# Patient Record
Sex: Female | Born: 1980 | Hispanic: Yes | Marital: Single | Smoking: Never smoker
Health system: Southern US, Community
[De-identification: ages and names within clinical notes are randomized; demographics above are authoritative.]

## PROBLEM LIST (undated history)

## (undated) ENCOUNTER — Inpatient Hospital Stay (HOSPITAL_COMMUNITY): Payer: Self-pay

## (undated) DIAGNOSIS — O09299 Supervision of pregnancy with other poor reproductive or obstetric history, unspecified trimester: Secondary | ICD-10-CM

## (undated) DIAGNOSIS — O4590 Premature separation of placenta, unspecified, unspecified trimester: Secondary | ICD-10-CM

## (undated) DIAGNOSIS — R110 Nausea: Secondary | ICD-10-CM

## (undated) DIAGNOSIS — I10 Essential (primary) hypertension: Secondary | ICD-10-CM

## (undated) DIAGNOSIS — N926 Irregular menstruation, unspecified: Secondary | ICD-10-CM

## (undated) DIAGNOSIS — Z8759 Personal history of other complications of pregnancy, childbirth and the puerperium: Secondary | ICD-10-CM

## (undated) DIAGNOSIS — R109 Unspecified abdominal pain: Secondary | ICD-10-CM

## (undated) DIAGNOSIS — D65 Disseminated intravascular coagulation [defibrination syndrome]: Secondary | ICD-10-CM

## (undated) HISTORY — DX: Unspecified abdominal pain: R10.9

## (undated) HISTORY — DX: Nausea: R11.0

## (undated) HISTORY — DX: Irregular menstruation, unspecified: N92.6

---

## 2007-05-02 ENCOUNTER — Ambulatory Visit (HOSPITAL_COMMUNITY): Admission: RE | Admit: 2007-05-02 | Discharge: 2007-05-02 | Payer: Self-pay | Admitting: Obstetrics and Gynecology

## 2008-12-23 ENCOUNTER — Emergency Department (HOSPITAL_COMMUNITY): Admission: EM | Admit: 2008-12-23 | Discharge: 2008-12-23 | Payer: Self-pay | Admitting: Emergency Medicine

## 2010-07-12 ENCOUNTER — Other Ambulatory Visit (HOSPITAL_COMMUNITY)
Admission: RE | Admit: 2010-07-12 | Discharge: 2010-07-12 | Disposition: A | Discharge status: Home / Self Care | Payer: Self-pay | Source: Ambulatory Visit | Attending: Obstetrics and Gynecology | Admitting: Obstetrics and Gynecology

## 2010-07-12 DIAGNOSIS — Z01419 Encounter for gynecological examination (general) (routine) without abnormal findings: Secondary | ICD-10-CM | POA: Insufficient documentation

## 2010-07-12 DIAGNOSIS — Z113 Encounter for screening for infections with a predominantly sexual mode of transmission: Secondary | ICD-10-CM | POA: Insufficient documentation

## 2010-09-18 LAB — URINE MICROSCOPIC-ADD ON

## 2010-09-18 LAB — URINALYSIS, ROUTINE W REFLEX MICROSCOPIC
Nitrite: NEGATIVE
Specific Gravity, Urine: 1.025 (ref 1.005–1.030)
pH: 5.5 (ref 5.0–8.0)

## 2010-09-18 LAB — PREGNANCY, URINE: Preg Test, Ur: NEGATIVE

## 2010-11-04 DIAGNOSIS — O1420 HELLP syndrome (HELLP), unspecified trimester: Secondary | ICD-10-CM

## 2010-11-05 ENCOUNTER — Encounter (HOSPITAL_COMMUNITY): Payer: Self-pay | Admitting: Radiology

## 2010-11-05 ENCOUNTER — Inpatient Hospital Stay (HOSPITAL_COMMUNITY): Payer: Medicaid Other

## 2010-11-05 ENCOUNTER — Inpatient Hospital Stay (HOSPITAL_COMMUNITY)
Admission: AD | Admit: 2010-11-05 | Discharge: 2010-11-09 | DRG: 774 | Disposition: A | Discharge status: Home / Self Care | Payer: Medicaid Other | Source: Ambulatory Visit | Attending: Obstetrics & Gynecology | Admitting: Obstetrics & Gynecology

## 2010-11-05 DIAGNOSIS — O1414 Severe pre-eclampsia complicating childbirth: Secondary | ICD-10-CM | POA: Diagnosis present

## 2010-11-05 DIAGNOSIS — D689 Coagulation defect, unspecified: Secondary | ICD-10-CM | POA: Diagnosis present

## 2010-11-05 DIAGNOSIS — R109 Unspecified abdominal pain: Secondary | ICD-10-CM

## 2010-11-05 DIAGNOSIS — D65 Disseminated intravascular coagulation [defibrination syndrome]: Secondary | ICD-10-CM

## 2010-11-05 DIAGNOSIS — O34219 Maternal care for unspecified type scar from previous cesarean delivery: Secondary | ICD-10-CM | POA: Diagnosis present

## 2010-11-05 DIAGNOSIS — O4590 Premature separation of placenta, unspecified, unspecified trimester: Principal | ICD-10-CM | POA: Diagnosis present

## 2010-11-05 DIAGNOSIS — O364XX0 Maternal care for intrauterine death, not applicable or unspecified: Secondary | ICD-10-CM | POA: Diagnosis not present

## 2010-11-05 DIAGNOSIS — IMO0002 Reserved for concepts with insufficient information to code with codable children: Secondary | ICD-10-CM

## 2010-11-05 LAB — COMPREHENSIVE METABOLIC PANEL
AST: 37 U/L (ref 0–37)
Alkaline Phosphatase: 274 U/L — ABNORMAL HIGH (ref 39–117)
BUN: 19 mg/dL (ref 6–23)
CO2: 15 mEq/L — ABNORMAL LOW (ref 19–32)
Calcium: 8.9 mg/dL (ref 8.4–10.5)
GFR calc Af Amer: >60 mL/min (ref >60)
GFR calc non Af Amer: >60 mL/min (ref >60)
Glucose, Bld: 94 mg/dL (ref 70–99)
Potassium: 4.3 mEq/L (ref 3.5–5.1)
Sodium: 133 mEq/L — ABNORMAL LOW (ref 135–145)
Total Bilirubin: 1 mg/dL (ref 0.3–1.2)
Total Protein: 6.5 g/dL (ref 6.0–8.3)

## 2010-11-05 LAB — CBC
HCT: 35 % — ABNORMAL LOW (ref 36.0–46.0)
Hemoglobin: 12 g/dL (ref 12.0–15.0)
MCH: 29 pg (ref 26.0–34.0)
Platelets: 108 10*3/uL — ABNORMAL LOW (ref 150–400)
RBC: 4.14 MIL/uL (ref 3.87–5.11)
RDW: 13.2 % (ref 11.5–15.5)
WBC: 18 10*3/uL — ABNORMAL HIGH (ref 4.0–10.5)

## 2010-11-06 ENCOUNTER — Other Ambulatory Visit: Payer: Self-pay | Admitting: Obstetrics and Gynecology

## 2010-11-06 ENCOUNTER — Inpatient Hospital Stay (HOSPITAL_COMMUNITY): Payer: Self-pay

## 2010-11-06 ENCOUNTER — Inpatient Hospital Stay (HOSPITAL_COMMUNITY): Payer: Medicaid Other

## 2010-11-06 DIAGNOSIS — O9912 Other diseases of the blood and blood-forming organs and certain disorders involving the immune mechanism complicating childbirth: Secondary | ICD-10-CM

## 2010-11-06 DIAGNOSIS — D65 Disseminated intravascular coagulation [defibrination syndrome]: Secondary | ICD-10-CM

## 2010-11-06 DIAGNOSIS — O4590 Premature separation of placenta, unspecified, unspecified trimester: Secondary | ICD-10-CM

## 2010-11-06 DIAGNOSIS — O364XX0 Maternal care for intrauterine death, not applicable or unspecified: Secondary | ICD-10-CM

## 2010-11-06 DIAGNOSIS — D684 Acquired coagulation factor deficiency: Secondary | ICD-10-CM

## 2010-11-06 DIAGNOSIS — D689 Coagulation defect, unspecified: Secondary | ICD-10-CM

## 2010-11-06 DIAGNOSIS — O1410 Severe pre-eclampsia, unspecified trimester: Secondary | ICD-10-CM

## 2010-11-06 LAB — URINALYSIS, DIPSTICK ONLY
Ketones, ur: 15 mg/dL — AB
Leukocytes, UA: NEGATIVE
Specific Gravity, Urine: >1.03 — ABNORMAL HIGH (ref 1.005–1.030)
pH: 5.5 (ref 5.0–8.0)

## 2010-11-06 LAB — CBC
HCT: 26.7 % — ABNORMAL LOW (ref 36.0–46.0)
HCT: 25.8 % — ABNORMAL LOW (ref 36.0–46.0)
Hemoglobin: 9 g/dL — ABNORMAL LOW (ref 12.0–15.0)
Hemoglobin: 8.9 g/dL — ABNORMAL LOW (ref 12.0–15.0)
MCH: 29.5 pg (ref 26.0–34.0)
MCH: 29.4 pg (ref 26.0–34.0)
MCHC: 33.7 g/dL (ref 30.0–36.0)
MCHC: 35.1 g/dL (ref 30.0–36.0)
MCV: 85.3 fL (ref 78.0–100.0)
MCV: 84.9 fL (ref 78.0–100.0)
MCV: 84.8 fL (ref 78.0–100.0)
MCV: 83.6 fL (ref 78.0–100.0)
Platelets: 110 10*3/uL — ABNORMAL LOW (ref 150–400)
Platelets: 80 10*3/uL — ABNORMAL LOW (ref 150–400)
Platelets: 97 10*3/uL — ABNORMAL LOW (ref 150–400)
RBC: 3.13 MIL/uL — ABNORMAL LOW (ref 3.87–5.11)
RDW: 13.4 % (ref 11.5–15.5)
RDW: 13.6 % (ref 11.5–15.5)
RDW: 13.8 % (ref 11.5–15.5)
WBC: 14.8 10*3/uL — ABNORMAL HIGH (ref 4.0–10.5)

## 2010-11-06 LAB — DIC (DISSEMINATED INTRAVASCULAR COAGULATION)PANEL
D-Dimer, Quant: >20 ug/mL-FEU — ABNORMAL HIGH (ref 0.00–0.48)
D-Dimer, Quant: >20 ug/mL-FEU — ABNORMAL HIGH (ref 0.00–0.48)
INR: 1.18 (ref 0.00–1.49)
INR: 1.19 (ref 0.00–1.49)
Platelets: 112 10*3/uL — ABNORMAL LOW (ref 150–400)
Prothrombin Time: 15.2 seconds (ref 11.6–15.2)
Smear Review: NONE SEEN
Smear Review: NONE SEEN

## 2010-11-06 LAB — COMPREHENSIVE METABOLIC PANEL
ALT: 12 U/L (ref 0–35)
AST: 35 U/L (ref 0–37)
Albumin: 2.1 g/dL — ABNORMAL LOW (ref 3.5–5.2)
Alkaline Phosphatase: 318 U/L — ABNORMAL HIGH (ref 39–117)
Chloride: 102 mEq/L (ref 96–112)
Chloride: 102 mEq/L (ref 96–112)
GFR calc Af Amer: >60 mL/min (ref >60)
GFR calc Af Amer: >60 mL/min (ref >60)
Glucose, Bld: 148 mg/dL — ABNORMAL HIGH (ref 70–99)
Potassium: 4.3 mEq/L (ref 3.5–5.1)
Potassium: 4.8 mEq/L (ref 3.5–5.1)
Sodium: 136 mEq/L (ref 135–145)
Total Bilirubin: 1.3 mg/dL — ABNORMAL HIGH (ref 0.3–1.2)
Total Protein: 5.9 g/dL — ABNORMAL LOW (ref 6.0–8.3)
Total Protein: 4.9 g/dL — ABNORMAL LOW (ref 6.0–8.3)

## 2010-11-06 LAB — URINALYSIS, MICROSCOPIC ONLY
Leukocytes, UA: NEGATIVE
Specific Gravity, Urine: >1.03 — ABNORMAL HIGH (ref 1.005–1.030)
Urobilinogen, UA: 0.2 mg/dL (ref 0.0–1.0)

## 2010-11-06 LAB — MRSA PCR SCREENING: MRSA by PCR: NEGATIVE

## 2010-11-06 LAB — BASIC METABOLIC PANEL
BUN: 21 mg/dL (ref 6–23)
Creatinine, Ser: 0.85 mg/dL (ref 0.4–1.2)
GFR calc non Af Amer: >60 mL/min (ref >60)

## 2010-11-06 LAB — MAGNESIUM: Magnesium: 7.4 mg/dL (ref 1.5–2.5)

## 2010-11-06 LAB — RPR: RPR Ser Ql: NONREACTIVE

## 2010-11-07 ENCOUNTER — Inpatient Hospital Stay (HOSPITAL_COMMUNITY): Payer: Medicaid Other

## 2010-11-07 ENCOUNTER — Encounter (HOSPITAL_COMMUNITY): Payer: Self-pay | Admitting: Radiology

## 2010-11-07 LAB — CBC
HCT: 26.6 % — ABNORMAL LOW (ref 36.0–46.0)
HCT: 26.7 % — ABNORMAL LOW (ref 36.0–46.0)
MCH: 28.9 pg (ref 26.0–34.0)
MCH: 28.7 pg (ref 26.0–34.0)
MCV: 80.9 fL (ref 78.0–100.0)
MCV: 81.4 fL (ref 78.0–100.0)
Platelets: 76 10*3/uL — ABNORMAL LOW (ref 150–400)
Platelets: 83 10*3/uL — ABNORMAL LOW (ref 150–400)
RBC: 3.28 MIL/uL — ABNORMAL LOW (ref 3.87–5.11)
RDW: 13.9 % (ref 11.5–15.5)
WBC: 12.9 10*3/uL — ABNORMAL HIGH (ref 4.0–10.5)

## 2010-11-07 LAB — PREPARE CRYOPRECIPITATE
Unit division: 0
Unit division: 0
Unit division: 0
Unit division: 0
Unit division: 0
Unit division: 0
Unit division: 0
Unit division: 0

## 2010-11-07 LAB — PREPARE FRESH FROZEN PLASMA: Unit division: 0

## 2010-11-07 LAB — PREPARE PLATELET PHERESIS: Unit division: 0

## 2010-11-07 LAB — DIC (DISSEMINATED INTRAVASCULAR COAGULATION)PANEL
D-Dimer, Quant: 3.87 ug/mL-FEU — ABNORMAL HIGH (ref 0.00–0.48)
Fibrinogen: 260 mg/dL (ref 204–475)
Prothrombin Time: 13.8 seconds (ref 11.6–15.2)

## 2010-11-07 LAB — COMPREHENSIVE METABOLIC PANEL
Albumin: 1.8 g/dL — ABNORMAL LOW (ref 3.5–5.2)
Alkaline Phosphatase: 232 U/L — ABNORMAL HIGH (ref 39–117)
BUN: 16 mg/dL (ref 6–23)
Creatinine, Ser: 0.7 mg/dL (ref 0.4–1.2)
Glucose, Bld: 85 mg/dL (ref 70–99)
Total Bilirubin: 0.6 mg/dL (ref 0.3–1.2)
Total Protein: 4.5 g/dL — ABNORMAL LOW (ref 6.0–8.3)

## 2010-11-07 LAB — MAGNESIUM: Magnesium: 5 mg/dL — ABNORMAL HIGH (ref 1.5–2.5)

## 2010-11-08 LAB — CROSSMATCH
ABO/RH(D): O POS
Antibody Screen: NEGATIVE
Unit division: 0
Unit division: 0
Unit division: 0
Unit division: 0

## 2010-11-08 LAB — CBC
HCT: 26 % — ABNORMAL LOW (ref 36.0–46.0)
Hemoglobin: 9.1 g/dL — ABNORMAL LOW (ref 12.0–15.0)
MCH: 28.9 pg (ref 26.0–34.0)
MCHC: 35 g/dL (ref 30.0–36.0)
MCV: 82.5 fL (ref 78.0–100.0)
RBC: 3.15 MIL/uL — ABNORMAL LOW (ref 3.87–5.11)

## 2010-11-08 LAB — PREPARE PLATELET PHERESIS

## 2010-11-08 LAB — COMPREHENSIVE METABOLIC PANEL
AST: 21 U/L (ref 0–37)
BUN: 9 mg/dL (ref 6–23)
CO2: 28 mEq/L (ref 19–32)
Calcium: 6.8 mg/dL — ABNORMAL LOW (ref 8.4–10.5)
Chloride: 96 mEq/L (ref 96–112)
Creatinine, Ser: 0.52 mg/dL (ref 0.4–1.2)
GFR calc non Af Amer: >60 mL/min (ref >60)
Glucose, Bld: 80 mg/dL (ref 70–99)
Total Bilirubin: 0.4 mg/dL (ref 0.3–1.2)

## 2010-11-10 NOTE — Discharge Summary (Signed)
NAME:  Sheena Wagner, Sheena Wagner        ACCOUNT NO.:  [id number]  MEDICAL RECORD NO.:  [id number]           PATIENT TYPE:  I  LOCATION:  9318                          FACILITY:  [facility]  PHYSICIAN:  Lucina Mellow, DO   DATE OF BIRTH:  11/02/1980  DATE OF ADMISSION:  11/05/2010 DATE OF DISCHARGE:  11/09/2010                              DISCHARGE SUMMARY   HISTORY OF PRESENT ILLNESS:  The patient is a 30 year old Hispanic female, gravida 3, now para 3-0-0-3 who presented at 31 weeks estimated gestational age with constant abdominal pain to the MAU.  She is a [facility] patient who began her prenatal care at approximately 17 weeks with the previous obstetrical history of a c-section in 2002 and a vaginal birth after cesarean section in 2004.  The patient had no significant obstetrical complications during this current pregnancy by records.  When she presented to the MAU, she was noted to be hypertensive at 160/110.  Fetal heart tones were unable to be obtained, then ultrasound verified that there was an IUD.  There was also ultrasound evidence of a placental abruption.  The patient was also diagnosed with preeclampsia.  The patient was admitted for induction of labor with Pitocin and magnesium sulfate was given.  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  The patient received 8 units of packed red blood cells, 4 units of fresh frozen plasma, and 3 packs of platelets during her induction and delivery.  A Bakri balloon was placed after 3500 mL of clot were measured and weighed after the delivery of the placenta.  The Bakri balloon was discontinued completely on Nov 07, 2010.  The patient received 24 hours of magnesium sulfate after the delivery of her infant.  The patient tolerated the postpartum period well and developed some face and neck pain which was relieved with Flexeril.  The patient's bleeding slowed and her platelets came  up to 100 on the day of discharge as well as a hemoglobin of 9.1.  The patient feels well and is ready to go home.  Plans for the infant remains are that the family is going to have the baby cremated and they will fly the remains back to [place].  The patient continued to have elevated blood pressures during her postpartum period and was placed on hydrochlorothiazide 25 once a day and labetalol 400 twice a day.  The patient's blood pressures were controlled at the time of discharge at 136/81.  The patient is having very light lochia and her pain is controlled.  Upon discharge, the patient is given instructions that she will follow up in [facility] on Friday, November 17, 2010 at 9:30.  She is recommended to abstain from intercourse and provide pelvic rest for at least 4-6 weeks.  The patient is instructed that she should consult with her OB/GYN provider or primary care provider prior to conceiving again to ensure that she is no longer having issues with anemia, bleeding, or blood pressure and these are treated adequately.  She is also instructed that subsequent pregnancies have a high risk of having the same complications with blood  pressure, i.e. preeclampsia, HELLP syndrome, and DIC.  The patient and her partner voiced understanding.  On discharge, the patient is instructed that she should return to the MAU promptly if she develops headache, vision changes, shortness of breath or chest pain or her bleeding increases.  DISCHARGE MEDICATIONS: 1. Hydrochlorothiazide 25 mg each day. 2. Labetalol 400 mg twice a day. 3. Percocet 5/325 one tablet every 4 hours as needed for pain and she     is recommended to purchase them each day. 4. Over-the-counter prenatal vitamins. 5. Colace 100 mg. 6. Iron 325 mg.  All the patient's questions were answered and she is discharged home in stable condition.          ______________________________ Lucina Mellow, DO     SH/MEDQ  D:  11/09/2010   T:  Dec 09, 2010  Job:  [id number]  Electronically Signed by Lucina Mellow MD on 12-09-10 02:17:08 PM

## 2010-12-10 DEATH — deceased

## 2011-04-09 ENCOUNTER — Other Ambulatory Visit: Payer: Self-pay | Admitting: Obstetrics and Gynecology

## 2011-04-12 ENCOUNTER — Ambulatory Visit (HOSPITAL_COMMUNITY)
Admission: RE | Admit: 2011-04-12 | Discharge: 2011-04-12 | Disposition: A | Discharge status: Home / Self Care | Payer: Medicaid Other | Source: Ambulatory Visit | Attending: Obstetrics and Gynecology | Admitting: Obstetrics and Gynecology

## 2011-04-12 ENCOUNTER — Other Ambulatory Visit (HOSPITAL_COMMUNITY): Payer: Self-pay | Admitting: Obstetrics and Gynecology

## 2011-04-12 ENCOUNTER — Other Ambulatory Visit (HOSPITAL_COMMUNITY): Payer: Medicaid Other

## 2011-04-12 DIAGNOSIS — N854 Malposition of uterus: Secondary | ICD-10-CM | POA: Insufficient documentation

## 2011-04-12 DIAGNOSIS — N949 Unspecified condition associated with female genital organs and menstrual cycle: Secondary | ICD-10-CM | POA: Insufficient documentation

## 2011-08-09 ENCOUNTER — Emergency Department (HOSPITAL_COMMUNITY): Payer: Self-pay

## 2011-08-09 ENCOUNTER — Encounter (HOSPITAL_COMMUNITY): Payer: Self-pay

## 2011-08-09 ENCOUNTER — Emergency Department (HOSPITAL_COMMUNITY)
Admission: EM | Admit: 2011-08-09 | Discharge: 2011-08-09 | Disposition: A | Discharge status: Home / Self Care | Payer: Self-pay | Attending: Emergency Medicine | Admitting: Emergency Medicine

## 2011-08-09 DIAGNOSIS — M25561 Pain in right knee: Secondary | ICD-10-CM

## 2011-08-09 DIAGNOSIS — M25569 Pain in unspecified knee: Secondary | ICD-10-CM | POA: Insufficient documentation

## 2011-08-09 NOTE — ED Provider Notes (Addendum)
History     CSN: [id number]  Arrival date & time 08/09/11  1105   First MD Initiated Contact with Patient 08/09/11 1132      Chief Complaint  Patient presents with  . Abscess    (Consider location/radiation/quality/duration/timing/severity/associated sxs/prior treatment) HPI Comments: C/o R knee pain ~ 6 months.  No known injury.  Worse in past few days.  The history is provided by the patient. A language interpreter was used (pt's english speaking friend is with her.).    History reviewed. No pertinent past medical history.  History reviewed. No pertinent past surgical history.  No family history on file.  History  Substance Use Topics  . Smoking status: Not on file  . Smokeless tobacco: Not on file  . Alcohol Use: No    OB History    Grav Para Term Preterm Abortions TAB SAB Ect Mult Living   1               Review of Systems  Musculoskeletal:       Knee pain  All other systems reviewed and are negative.    Allergies  Review of patient's allergies indicates no known allergies.  Home Medications  No current outpatient prescriptions on file.  BP 124/80  Pulse 90  Temp(Src) 97.9 F (36.6 C) (Oral)  Resp 18  Wt 135 lb (61.236 kg)  SpO2 100%  LMP 07/19/2011  Physical Exam  Nursing note and vitals reviewed. Constitutional: She is oriented to person, place, and time. She appears well-developed and well-nourished. No distress.  HENT:  Head: Normocephalic and atraumatic.  Eyes: EOM are normal.  Neck: Normal range of motion.  Cardiovascular: Normal rate, regular rhythm and normal heart sounds.   Pulmonary/Chest: Effort normal and breath sounds normal.  Abdominal: Soft. She exhibits no distension. There is no tenderness.  Musculoskeletal: She exhibits tenderness.       Legs: Neurological: She is alert and oriented to person, place, and time.  Skin: Skin is warm and dry.  Psychiatric: She has a normal mood and affect. Judgment normal.    ED Course    Procedures (including critical care time)  Labs Reviewed - No data to display Dg Knee Complete 4 Views Right  08/09/2011  *RADIOLOGY REPORT*  Clinical Data: Right knee pain.  RIGHT KNEE - COMPLETE 4+ VIEW  Comparison: None.  Findings: There is no fracture, dislocation, joint effusion, or bone destruction.  IMPRESSION: Normal right knee.  Original Report Authenticated By: Gwynn Burly, M.D.     1. Right knee pain       MDM  Normal x-rays of knee.  Suspect baker's cyst.  Have ordered u/s of R leg to  Further evaluate.  Ice, ibuprofen and ortho f/u        Worthy Rancher, [place] 08/09/11 1605  Worthy Rancher, [place] 08/09/11 [phone]

## 2011-08-09 NOTE — ED Provider Notes (Signed)
Medical screening examination/treatment/procedure(s) were performed by non-physician practitioner and as supervising physician I was immediately available for consultation/collaboration.   Benny Lennert, MD 08/09/11 [phone]

## 2011-08-09 NOTE — ED Notes (Signed)
R Miller in with pt

## 2011-08-09 NOTE — Discharge Instructions (Signed)
Dolor en la rodilla (Knee Pain) La rodilla es la articulacin compleja entre el muslo y la parte inferior de la pierna. En esta articulacin hay huesos, tendones, ligamentos y TEFL teacher. Los huesos que forman la rodilla son:  El fmur en el muslo.   La tibia y el peron en la pierna.   La rtula montada en la ranura de la parte inferior del muslo.  CAUSAS El dolor de rodilla es una causa frecuente de [place] y puede tener varias causas. Algunas son:  Lesiones como:   Ruptura de ligamento o lesin en el tendn.   Esguince del Armed forces operational officer como:   Gota   Artritis   Infecciones   Uso excesivo, demasiado entrenamiento o mucha actividad fsica.  El dolor de rodilla puede ser leve o intenso. Puede acompaar una lesin debilitante. Los problemas leves con frecuencia responden bien a tratamientos caseros o se mejoran por s mismas. Las lesiones ms graves pueden requerir la intervencin del mdico y [place]. SNTOMAS La rodilla es una articulacin compleja. Los sntomas pueden variar ampliamente Algunos son:  Dolor con el movimiento o al soportar peso.   Hinchazn y Engineer, mining.   Torsin de la rodilla.   Imposibilidad para estirar la rodilla.   La rodilla se traba y no puede enderezarla.   Siente calor y se observa enrojecimiento con dolor y [place].   Deformidad o dislocacin de la rtula.  DIAGNSTICO Determinar cual es el problema puede ser bastante simple, como cuando hay una lesin. Tambin puede ser [employer] debido a la complejidad de la rodilla. Las pruebas para Education officer, environmental un diagnstico son:  Marily Memos y examen fsico por parte del mdico.   Radiografas para descartar otros problemas. Las radiografas no mostrarn la ruptura del TEFL teacher. Algunas lesiones en la rodilla pueden diagnosticarse del siguiente modo:   La artroscopia es una tcnica quirrgica por la que una pequea cmara de vdeo se inserta en pequeas incisiones que se hacen a los  lados de la rodilla. Este procedimiento se [place] para examinar y Therapist, sports los problemas de la articulacin interna de la rodilla. Se utilizan pequeos instrumentos para reparar el cartlago roto (meniscos).   La artrografa es una tcnica radiolgica. Se inyecta un lquido de contraste en la articulacin de la rodilla. Las estructuras internas de la articulacin de la rodilla se hacen visibles en una pelcula de rayos X.   Las imgenes por resonancia magntica son un procedimiento en el que los campos magnticos y una computadora producen imgenes en dos o tres dimensiones del interior de la rodilla. La ruptura del cartlago es visible con esta tcnica. La resonancia magntica ha reemplazado a la artrografa en el diagnstico de la ruptura del cartlago de la rodilla.   Anlisis de [place].   Examen del lquido que lubrica la articulacin de la rodilla (lquido sinovial). Se realiza tomando [place] con [place].  TRATAMIENTO El tratamiento de los problemas de la rodilla depende fundamentalmente de la causa. Algunos de estos tratamientos son:  Segn sea la lesin, un yeso o entablillado, ciruga o fisioterapia.   Permtase el tiempo adecuado de recuperacin. No use demasiado su extremidad lesionada. Si siente dolor durante los ejercicios de rutina, suspndalos. Hgalos ms lentos o realice menos repeticiones.   En el caso de actividades repetitivas como andar en bicicleta o correr, mantenga la fuerza y [name] buena nutricin.   Alterne los grupos musculares. Por ejemplo, si levanta pesas, trabaje la parte superior del cuerpo Civil engineer, contracting, y  la parte inferior al da siguiente.   Ni los msculos firmes ni los dbiles proporcionan un sostn adecuado a la rodilla. Los msculos no absorben el estrs que se ejerce sobre la articulacin de la rodilla. Mantenga fuertes los msculos que rodean a la rodilla.   Cudese de los problemas mecnicos:   Si tiene pie plano, los zapatos ortopdicos o  especiales pueden ayudar. Comunquese con el profesional que lo asiste si necesita ayuda adicional.   Los soporte de arco con bordes en la zona interna o interna del taco pueden ayudar. Cambian la presin del lado de la rodilla ms comprometido por la osteoartritis.   Podrn colocarle una ortesis de rodilla para aliviar la presin en la zona ms artrtica de la rodilla.   Si el profesional le ha prescripto muletas, ortesis, un vendaje o hielo, hgalo segn las indicaciones. El acrnimo para este tratamiento es PRICE. Significa proteccin, reposo, hielo, compresin y elevacin.   Los antiinflamatorios no esteroides, pueden ayudar a Engineer, materials. Pero si se toman inmediatamente luego de la lesin, podran aumentar la hinchazn. Tome los corticoides luego de Clinical cytogeneticist. Suspndalos si tiene problemas estomacales. No los tome si tiene una historia de Occupational hygienist, Engineer, mining en el estmago o hemorragia intestinal. No lo tome sin la aprobacin del profesional que la asiste si tiene problemas de retencin de lquidos, insuficencia cardaca o problemas renales.   En los casos crnicos, la fisioterapia puede ser de [place].   La glucosamina y el condroitin son suplementos dietarios de Sales promotion account executive. Ambos pueden Engineer, materials de la osteoartritis de la rodilla. Estos medicamentos son diferentes de los antiinflamatorios habituales. La glucosamina puede disminuir el porcentaje de destruccin del cartlago.   Las inyecciones de corticoides en la articulacin de la rodilla reducen los sntomas de un brote de artritis. Ofrecen alivio que dura algunos meses. Hay que esperar algunos meses entre la aplicacin de inyecciones. Las inyecciones tiene un pequeo riesgo de infeccin, retencin de lquidos y Engineer, structural de los niveles de Production assistant, radio.   El cido hialurnico inyectado en las articulaciones lesionadas puede aliviar el dolor y proporciona lubricacin. Estas inyecciones funcionan bien reduciendo la inflamacin. Una  serie de inyecciones puede proporcionar alivio durante seis meses.   Analgsicos locales. Aplicar ciertos ungentos sobre la piel puede ayudar a Engineer, materials y la rigidez de la osteoartritis. Consulte con el farmacutico, si es necesario. Muchos medicamentos de venta libre estn aprobados para el alivio temporario del dolor artrtico.   En algunos pases los mdicos prescriben antiinflamatorios no esteroides para el alivio de los trastornos crnicos como la artritis y la tendinitis. Un estudio del tratamiento con antiinflamatorios no esteroides aplicados en crema, demostr que funcionaban bien, as como administrados por va oral, pero sin el peligro de los Secretary.  PREVENCIN  Mantenga un peso normal. Los kilos de ms agregan tensin a las articulaciones.   Mantngase fuerte y gil. Los msculos dbiles son [name] causa frecuente de lesiones en la rodilla. La elongacin es importante. Incluya ejercicios de flexibilidad en sus rutinas.   Practique actividad fsica con inteligencia. Si sufre osteoartritis, dolor crnico en la rodilla o lesiones recurrentes, podr ser necesario que modifique el modo en que se ejercita. No significa que deba volverse inactivo. Si le duelen las rodillas despus de correr o jugar basketball, considere la prctica de la natacin, ejercicios aerbicos en el agua u otras actividades de bajo [place], al menos durante [address] o [employer]. En algunos casos, el [employer]  actividades de alto impacto ofrece alivio.   Asegrese que sus zapatos le [place]. Elija el calzado deportivo adecuado para su deporte.   Proteja sus rodillas. Use la proteccin adecuada para las actividades que puedan afectar a sus rodillas. Use rodilleras cuando juegue al vley o se arrodille. Colquese el cinturn de seguridad cada vez que conduzca. La mayor parte de las fracturas de rtula ocurren en accidentes automvilsticos.   Descanse cuando se sienta cansado.  SOLICITE  ATENCIN MDICA SI: Tiene dolor en la rodilla que es continuo y no parece mejorar.  SOLICITE ATENCIN MDICA DE INMEDIATO SI:  La articulacin de la rodilla se siente caliente al tacto y usted tiene fiebre. EST SEGURO QUE:   Comprende las instrucciones para el alta mdica.   Controlar su enfermedad.   Solicitar atencin mdica de inmediato segn las indicaciones.  Document Released: 11/14/2007 Document Revised: 02/07/2011 [facility] Patient Information 2012 [place], [place].    The x-rays of your right knee are normal.  We have ordered an ultrasound of the right knee.  After the test call dr. Hilda Lias and make an appt for further evaluation.

## 2011-08-09 NOTE — ED Notes (Signed)
Alert, NAD,  Swelling , pain rt popliteal area for 3 mos, getting  "worse"..Also headache with rt eye tearing.  Pt speaks very little English, has interpretor with her.

## 2011-08-09 NOTE — ED Notes (Signed)
Complain of boil behind right knee. Also, having headaches

## 2011-08-10 ENCOUNTER — Ambulatory Visit (HOSPITAL_COMMUNITY)
Admit: 2011-08-10 | Discharge: 2011-08-10 | Disposition: A | Discharge status: Home / Self Care | Payer: Self-pay | Source: Ambulatory Visit | Attending: Emergency Medicine | Admitting: Emergency Medicine

## 2011-08-10 DIAGNOSIS — M25569 Pain in unspecified knee: Secondary | ICD-10-CM | POA: Insufficient documentation

## 2011-08-10 NOTE — ED Provider Notes (Signed)
Medical screening examination/treatment/procedure(s) were performed by non-physician practitioner and as supervising physician I was immediately available for consultation/collaboration.   Kendon Sedeno L Shraddha Lebron, MD 08/10/11 1443 

## 2011-11-25 ENCOUNTER — Inpatient Hospital Stay (HOSPITAL_COMMUNITY): Payer: Self-pay

## 2011-11-25 ENCOUNTER — Encounter (HOSPITAL_COMMUNITY): Payer: Self-pay | Admitting: *Deleted

## 2011-11-25 ENCOUNTER — Inpatient Hospital Stay (HOSPITAL_COMMUNITY)
Admission: AD | Admit: 2011-11-25 | Discharge: 2011-11-25 | Disposition: A | Discharge status: Home / Self Care | Payer: Self-pay | Source: Ambulatory Visit | Attending: Obstetrics & Gynecology | Admitting: Obstetrics & Gynecology

## 2011-11-25 DIAGNOSIS — O209 Hemorrhage in early pregnancy, unspecified: Secondary | ICD-10-CM | POA: Insufficient documentation

## 2011-11-25 DIAGNOSIS — R109 Unspecified abdominal pain: Secondary | ICD-10-CM | POA: Insufficient documentation

## 2011-11-25 LAB — DIFFERENTIAL
Basophils Absolute: 0 10*3/uL (ref 0.0–0.1)
Basophils Relative: 0 % (ref 0–1)
Eosinophils Absolute: 0.1 10*3/uL (ref 0.0–0.7)
Monocytes Relative: 8 % (ref 3–12)
Neutrophils Relative %: 62 % (ref 43–77)

## 2011-11-25 LAB — CBC
MCH: 29.4 pg (ref 26.0–34.0)
MCHC: 35.2 g/dL (ref 30.0–36.0)
Platelets: 276 10*3/uL (ref 150–400)
RDW: 12.5 % (ref 11.5–15.5)

## 2011-11-25 LAB — URINALYSIS, ROUTINE W REFLEX MICROSCOPIC
Bilirubin Urine: NEGATIVE
Specific Gravity, Urine: 1.025 (ref 1.005–1.030)
pH: 6 (ref 5.0–8.0)

## 2011-11-25 LAB — HCG, QUANTITATIVE, PREGNANCY: hCG, Beta Chain, Quant, S: 30190 m[IU]/mL — ABNORMAL HIGH (ref <5)

## 2011-11-25 LAB — WET PREP, GENITAL
Trich, Wet Prep: NONE SEEN
Yeast Wet Prep HPF POC: NONE SEEN

## 2011-11-25 LAB — ABO/RH: ABO/RH(D): O POS

## 2011-11-25 LAB — URINE MICROSCOPIC-ADD ON

## 2011-11-25 NOTE — MAU Provider Note (Signed)
History     CSN: [id number]  Arrival date & time 11/25/11  1140   None     Chief Complaint  Patient presents with  . Vaginal Bleeding    HPI first contact with patient 12:10 pm Sheena Wagner is a 31 y.o. female @ [redacted]w[redacted]d gestation who presents to MAU for vaginal bleeding and abdominal pain in early pregnancy. The pain started in the lower abdomen a few days ago and bleeding today. She describes the pain as cramping that comes and goes.The patient has been with her current sex partner x 4 years. The history was provided by the patient using the Spanish translator.   History reviewed. No pertinent past medical history.  Past Surgical History  Procedure Date  . Cesarean section 2003    History reviewed. No pertinent family history.  History  Substance Use Topics  . Smoking status: Never Smoker   . Smokeless tobacco: Not on file  . Alcohol Use: No    OB History    Grav Para Term Preterm Abortions TAB SAB Ect Mult Living   5 3   1  1   2       Review of Systems  Constitutional: Positive for fatigue. Negative for fever, chills and diaphoresis.  HENT: Negative for ear pain, congestion, sore throat, facial swelling, neck pain, neck stiffness, dental problem and sinus pressure.   Eyes: Negative for photophobia, pain and discharge.  Respiratory: Negative for cough, chest tightness and wheezing.   Gastrointestinal: Positive for nausea and abdominal pain. Negative for vomiting, diarrhea, constipation and abdominal distention.  Genitourinary: Positive for frequency, vaginal bleeding, vaginal discharge and pelvic pain. Negative for dysuria, urgency, flank pain and difficulty urinating.  Musculoskeletal: Negative for myalgias, back pain and gait problem.  Skin: Negative for color change and rash.  Neurological: Negative for dizziness, speech difficulty, weakness, light-headedness, numbness and headaches.  Psychiatric/Behavioral: Negative for confusion and agitation.    Allergies   Review of patient's allergies indicates no known allergies.  Home Medications  No current outpatient prescriptions on file.  BP 115/76  Pulse 86  Temp 98.8 F (37.1 C) (Oral)  Resp 18  LMP 10/05/2011  Breastfeeding? Unknown  Physical Exam  Nursing note and vitals reviewed. Constitutional: She is oriented to person, place, and time. She appears well-developed and well-nourished. No distress.  HENT:  Head: Normocephalic.  Eyes: EOM are normal.  Neck: Neck supple.  Cardiovascular: Normal rate.   Pulmonary/Chest: Effort normal.  Abdominal: Soft. There is tenderness in the right upper quadrant and left lower quadrant. There is no rigidity, no rebound, no guarding and no CVA tenderness.  Genitourinary:       External genitalia without lesions. Small blood vaginal vault. Cervix closed, long, positive CMT, bilateral adnexal tenderness, right >left.  Uterus slightly enlarged.  Musculoskeletal: Normal range of motion.  Neurological: She is alert and oriented to person, place, and time. No cranial nerve deficit.  Skin: Skin is warm and dry.  Psychiatric: She has a normal mood and affect. Her behavior is normal. Judgment and thought content normal.   Results for orders placed during the hospital encounter of 11/25/11 (from the past 24 hour(s))  URINALYSIS, ROUTINE W REFLEX MICROSCOPIC     Status: Abnormal   Collection Time   11/25/11 11:50 AM      Component Value Range   Color, Urine YELLOW  YELLOW   APPearance CLEAR  CLEAR   Specific Gravity, Urine 1.025  1.005 - 1.030   pH 6.0  5.0 - 8.0   Glucose, UA NEGATIVE  NEGATIVE mg/dL   Hgb urine dipstick TRACE (*) NEGATIVE   Bilirubin Urine NEGATIVE  NEGATIVE   Ketones, ur NEGATIVE  NEGATIVE mg/dL   Protein, ur NEGATIVE  NEGATIVE mg/dL   Urobilinogen, UA 0.2  0.0 - 1.0 mg/dL   Nitrite NEGATIVE  NEGATIVE   Leukocytes, UA NEGATIVE  NEGATIVE  URINE MICROSCOPIC-ADD ON     Status: Abnormal   Collection Time   11/25/11 11:50 AM       Component Value Range   Squamous Epithelial / LPF FEW (*) RARE   RBC / HPF 0-2  <3 RBC/hpf   Bacteria, UA RARE  RARE   Urine-Other MUCOUS PRESENT    POCT PREGNANCY, URINE     Status: Abnormal   Collection Time   11/25/11 11:57 AM      Component Value Range   Preg Test, Ur POSITIVE (*) NEGATIVE  CBC     Status: Normal   Collection Time   11/25/11 12:12 PM      Component Value Range   WBC 9.7  4.0 - 10.5 K/uL   RBC 4.70  3.87 - 5.11 MIL/uL   Hemoglobin 13.8  12.0 - 15.0 g/dL   HCT 16.1  09.6 - 04.5 %   MCV 83.4  78.0 - 100.0 fL   MCH 29.4  26.0 - 34.0 pg   MCHC 35.2  30.0 - 36.0 g/dL   RDW 40.9  81.1 - 91.4 %   Platelets 276  150 - 400 K/uL  DIFFERENTIAL     Status: Normal   Collection Time   11/25/11 12:12 PM      Component Value Range   Neutrophils Relative 62  43 - 77 %   Neutro Abs 6.0  1.7 - 7.7 K/uL   Lymphocytes Relative 29  12 - 46 %   Lymphs Abs 2.8  0.7 - 4.0 K/uL   Monocytes Relative 8  3 - 12 %   Monocytes Absolute 0.8  0.1 - 1.0 K/uL   Eosinophils Relative 1  0 - 5 %   Eosinophils Absolute 0.1  0.0 - 0.7 K/uL   Basophils Relative 0  0 - 1 %   Basophils Absolute 0.0  0.0 - 0.1 K/uL  HCG, QUANTITATIVE, PREGNANCY     Status: Abnormal   Collection Time   11/25/11 12:12 PM      Component Value Range   hCG, Beta Chain, Quant, S 30190 (*) <5 mIU/mL  ABO/RH     Status: Normal   Collection Time   11/25/11 12:12 PM      Component Value Range   ABO/RH(D) O POS    WET PREP, GENITAL     Status: Abnormal   Collection Time   11/25/11 12:29 PM      Component Value Range   Yeast Wet Prep HPF POC NONE SEEN  NONE SEEN   Trich, Wet Prep NONE SEEN  NONE SEEN   Clue Cells Wet Prep HPF POC FEW (*) NONE SEEN   WBC, Wet Prep HPF POC MODERATE (*) NONE SEEN   Ultrasound today shows a 7 week 2 day IUP with cardiac activity @ 130 bpm, EDD 07/11/12  Assessment: Viable IUP @ 7 weeks and 2 days   Bleeding in early pregnancy  Plan:  Instructions to patient for threatened AB   Start  prenatal care   Pregnancy verification letter given Verbal and written instructions given to the patient using the Spanish  translator. Patient voices understanding.   ED Course I have reviewed this patient's vital signs, nurses notes, appropriate labs and imaging.   Procedures  MDM

## 2011-11-25 NOTE — MAU Note (Signed)
"  I go to [facility] and the doctor said my due date is in February 2014.  The doctor said I was [redacted] weeks along about 2 week ago.  I am certain that my LNMP was 10/05/11.  I had an ultrasound about 2 weeks ago at the [facility] office." "I started bleeding and cramping this morning.  Yesterday I had a really bad stomach ache.  I didn't take any medication for it, the pain just passed.  Bleeding with very, very small piece of something in the toilet at home.  I had a BM when I saw the little bit of blood in there."

## 2011-11-25 NOTE — Discharge Instructions (Signed)
________________________________________     To schedule your Maternity Eligibility Appointment, please call [phone].  When you arrive for your appointment you must bring the following items or information listed below.  Your appointment will be rescheduled if you do not have these items or are 15 minutes late. If currently receiving Medicaid, you MUST bring: 1. Medicaid Card 2. Social Security Card 3. Picture ID 4. Proof of Pregnancy 5. Verification of current address if the address on Medicaid card is incorrect "postmarked mail" If not receiving Medicaid, you MUST bring: 1. Social Security Card 2. Picture ID 3. Birth Certificate (if available) Passport or *Green Card 4. Proof of Pregnancy 5. Verification of current address "postmarked mail" for each income presented. 6. Verification of insurance coverage, if any 7. Check stubs from each employer for the previous month (if unable to present check stub  for each week, we will accept check stub for the first and last week ill the same month.) If you can't locate check stubs, you must bring a letter from the employer(s) and it must have the following information on letterhead, typed, in English: o name of company o company telephone number o how long been with the company, if less than one month o how much person earns per hour o how many hours per week work o the gross pay the person earned for the previous month If you are 31 years old or less, you do not have to bring proof of income unless you work or live with the father of the baby and at that time we will need proof of income from you and/or the father of the baby. Green Card recipients are eligible for Medicaid for Pregnant Women (MPW)     Amenaza de aborto (Threatened Miscarriage) La hemorragia en las primeras 20 semanas de embarazo es algo frecuente. Se denomina amenaza de aborto. Es un embarazo que puede perderse. Generalmente, el sangrado se detiene con reposo en  cama. Puede disminuir o desaparecer al reducir [employer] de [place]. Su mdico Financial risk analyst. A menudo, el embarazo contina sin ms problemas. Le indicarn que evite:  Management consultant.   Tener orgasmos.   El uso de [place].   La actividad fsica Development worker, international aid.   Las actividades fsicas o trabajo extenuantes.  CUIDADOS EN EL HOGAR   Su mdico puede indicarle que haga reposo en cama y suspenda las actividades y [place].   Anote el nmero de toallas sanitarias que [place] cada da. Anote la frecuencia de cambio de las toallas sanitarias. Anote cun mojadas estn.   Siga las instrucciones del mdico para las visitas y las pruebas de [address].   Si su tipo de sangre es Rh negativo y Risk manager del padre es Rh positivo (o no se conoce), es posible que deba recibir una inyeccin para proteger al beb.   Si usted tiene un aborto involuntario, guarde todos los tejidos que expulse en un recipiente. Lleve el recipiente al mdico.  SOLICITE AYUDA DE INMEDIATO SI:   Usted tiene clicos fuertes o dolor en el abdomen, en la parte baja del vientre o en la espalda.   Tiene fiebre o escalofros.   El sangrado [place]. Elimina cogulos grandes o tejidos. Guarde este tejido para llevarlo al mdico.   Se siente mareada, dbil, o se desmaya.   Tiene una prdida de lquido por la vagina.  ASEGRESE DE QUE:   Comprende estas instrucciones.   Controlar su enfermedad.   Solicitar ayuda de  inmediato si no mejora o empeora.  Document Released: 06/30/2010 Document Revised: 05/17/2011 [facility] Patient Information 2012 [place], [place].

## 2011-11-25 NOTE — MAU Note (Signed)
Pt reports she started bleeding this morning small amount but having increased abd pain

## 2011-11-26 LAB — GC/CHLAMYDIA PROBE AMP, GENITAL
Chlamydia, DNA Probe: NEGATIVE
GC Probe Amp, Genital: NEGATIVE

## 2011-11-26 NOTE — MAU Provider Note (Signed)
Medical Screening exam and patient care preformed by advanced practice provider.  Agree with the above management.  

## 2012-06-11 NOTE — L&D Delivery Note (Signed)
Delivery Note At 12:38 AM a viable female was delivered via  (Presentation: LOA;  ).  APGAR: 8/9, ; weight pending .   Placenta status: Intact, Spontaneous.  Cord: 3vessels with the following complications: none  Anesthesia:  none Episiotomy: none Lacerations: none Suture Repair: n/a Est. Blood Loss (mL):  300  Mom to postpartum.  Baby to nursery-stable.  Delivery by Dr. Bonnita Nasuti under my supervision  CRESENZO-DISHMAN,Heidi Lemay 07/04/2012, 12:47 AM

## 2012-07-03 ENCOUNTER — Inpatient Hospital Stay (HOSPITAL_COMMUNITY)
Admission: AD | Admit: 2012-07-03 | Discharge: 2012-07-06 | DRG: 774 | Disposition: A | Discharge status: Home / Self Care | Payer: Medicaid Other | Source: Ambulatory Visit | Attending: Obstetrics & Gynecology | Admitting: Obstetrics & Gynecology

## 2012-07-03 ENCOUNTER — Encounter (HOSPITAL_COMMUNITY): Payer: Self-pay

## 2012-07-03 DIAGNOSIS — O1490 Unspecified pre-eclampsia, unspecified trimester: Secondary | ICD-10-CM

## 2012-07-03 DIAGNOSIS — IMO0002 Reserved for concepts with insufficient information to code with codable children: Principal | ICD-10-CM | POA: Diagnosis present

## 2012-07-03 DIAGNOSIS — O34219 Maternal care for unspecified type scar from previous cesarean delivery: Secondary | ICD-10-CM | POA: Diagnosis present

## 2012-07-03 HISTORY — DX: Premature separation of placenta, unspecified, unspecified trimester: O45.90

## 2012-07-03 HISTORY — DX: Supervision of pregnancy with other poor reproductive or obstetric history, unspecified trimester: O09.299

## 2012-07-03 HISTORY — DX: Essential (primary) hypertension: I10

## 2012-07-03 HISTORY — DX: Disseminated intravascular coagulation (defibrination syndrome): D65

## 2012-07-03 HISTORY — DX: Personal history of other complications of pregnancy, childbirth and the puerperium: Z87.59

## 2012-07-03 LAB — OB RESULTS CONSOLE RPR: RPR: NONREACTIVE

## 2012-07-03 LAB — PROTEIN / CREATININE RATIO, URINE
Creatinine, Urine: 18.86 mg/dL
Protein Creatinine Ratio: 0.65 — ABNORMAL HIGH (ref 0.00–0.15)
Total Protein, Urine: 12.2 mg/dL

## 2012-07-03 LAB — OB RESULTS CONSOLE GBS: GBS: NEGATIVE

## 2012-07-03 LAB — COMPREHENSIVE METABOLIC PANEL
ALT: 25 U/L (ref 0–35)
AST: 36 U/L (ref 0–37)
Albumin: 2.7 g/dL — ABNORMAL LOW (ref 3.5–5.2)
Calcium: 9 mg/dL (ref 8.4–10.5)
Creatinine, Ser: 0.57 mg/dL (ref 0.50–1.10)
Sodium: 134 mEq/L — ABNORMAL LOW (ref 135–145)
Total Protein: 6.7 g/dL (ref 6.0–8.3)

## 2012-07-03 LAB — CBC
Hemoglobin: 13.8 g/dL (ref 12.0–15.0)
MCHC: 35 g/dL (ref 30.0–36.0)
Platelets: 186 10*3/uL (ref 150–400)
RDW: 12.7 % (ref 11.5–15.5)

## 2012-07-03 LAB — OB RESULTS CONSOLE HEPATITIS B SURFACE ANTIGEN: Hepatitis B Surface Ag: NEGATIVE

## 2012-07-03 LAB — RPR: RPR Ser Ql: NONREACTIVE

## 2012-07-03 LAB — OB RESULTS CONSOLE HIV ANTIBODY (ROUTINE TESTING): HIV: NONREACTIVE

## 2012-07-03 MED ORDER — LABETALOL HCL 5 MG/ML IV SOLN
20.0000 mg | INTRAVENOUS | Status: DC | PRN
  Administered 2012-07-03: 20 mg via INTRAVENOUS
  Filled 2012-07-03: qty 4

## 2012-07-03 MED ORDER — SODIUM CHLORIDE 0.9 % IV SOLN: 250.0000 mL | INTRAVENOUS | Status: DC | PRN

## 2012-07-03 MED ORDER — OXYTOCIN 40 UNITS IN LACTATED RINGERS INFUSION - SIMPLE MED
1.0000 m[IU]/min | INTRAVENOUS | Status: DC
  Administered 2012-07-03: 1 m[IU]/min via INTRAVENOUS
  Filled 2012-07-03: qty 1000

## 2012-07-03 MED ORDER — OXYCODONE-ACETAMINOPHEN 5-325 MG PO TABS: 1.0000 | ORAL_TABLET | ORAL | Status: DC | PRN

## 2012-07-03 MED ORDER — CITRIC ACID-SODIUM CITRATE 334-500 MG/5ML PO SOLN: 30.0000 mL | ORAL | Status: DC | PRN

## 2012-07-03 MED ORDER — IBUPROFEN 600 MG PO TABS: 600.0000 mg | ORAL_TABLET | Freq: Four times a day (QID) | ORAL | Status: DC | PRN

## 2012-07-03 MED ORDER — ONDANSETRON HCL 4 MG/2ML IJ SOLN: 4.0000 mg | Freq: Four times a day (QID) | INTRAMUSCULAR | Status: DC | PRN

## 2012-07-03 MED ORDER — LIDOCAINE HCL (PF) 1 % IJ SOLN
30.0000 mL | INTRAMUSCULAR | Status: DC | PRN
  Filled 2012-07-03: qty 30

## 2012-07-03 MED ORDER — SODIUM CHLORIDE 0.9 % IJ SOLN: 3.0000 mL | INTRAMUSCULAR | Status: DC | PRN

## 2012-07-03 MED ORDER — EPHEDRINE 5 MG/ML INJ: 10.0000 mg | INTRAVENOUS | Status: DC | PRN

## 2012-07-03 MED ORDER — LACTATED RINGERS IV SOLN
INTRAVENOUS | Status: DC
  Administered 2012-07-03: 23:00:00 via INTRAVENOUS

## 2012-07-03 MED ORDER — FENTANYL 2.5 MCG/ML BUPIVACAINE 1/10 % EPIDURAL INFUSION (WH - ANES): 14.0000 mL/h | INTRAMUSCULAR | Status: DC

## 2012-07-03 MED ORDER — MAGNESIUM SULFATE 40 G IN LACTATED RINGERS - SIMPLE
2.0000 g/h | INTRAVENOUS | Status: DC
  Filled 2012-07-03: qty 500

## 2012-07-03 MED ORDER — OXYTOCIN 40 UNITS IN LACTATED RINGERS INFUSION - SIMPLE MED: 1.0000 m[IU]/min | INTRAVENOUS | Status: DC

## 2012-07-03 MED ORDER — MAGNESIUM SULFATE BOLUS VIA INFUSION
4.0000 g | Freq: Once | INTRAVENOUS | Status: AC
  Administered 2012-07-03: 4 g via INTRAVENOUS
  Filled 2012-07-03: qty 500

## 2012-07-03 MED ORDER — FENTANYL CITRATE 0.05 MG/ML IJ SOLN
100.0000 ug | INTRAMUSCULAR | Status: DC | PRN
  Administered 2012-07-03 (×2): 100 ug via INTRAVENOUS
  Filled 2012-07-03 (×2): qty 2

## 2012-07-03 MED ORDER — OXYTOCIN 40 UNITS IN LACTATED RINGERS INFUSION - SIMPLE MED: 62.5000 mL/h | INTRAVENOUS | Status: DC

## 2012-07-03 MED ORDER — PHENYLEPHRINE 40 MCG/ML (10ML) SYRINGE FOR IV PUSH (FOR BLOOD PRESSURE SUPPORT): 80.0000 ug | PREFILLED_SYRINGE | INTRAVENOUS | Status: DC | PRN

## 2012-07-03 MED ORDER — OXYTOCIN BOLUS FROM INFUSION
500.0000 mL | INTRAVENOUS | Status: DC
  Administered 2012-07-04: 500 mL via INTRAVENOUS

## 2012-07-03 MED ORDER — ACETAMINOPHEN 325 MG PO TABS
650.0000 mg | ORAL_TABLET | ORAL | Status: DC | PRN
  Administered 2012-07-03: 650 mg via ORAL
  Filled 2012-07-03: qty 2

## 2012-07-03 MED ORDER — SODIUM CHLORIDE 0.9 % IJ SOLN: 3.0000 mL | Freq: Two times a day (BID) | INTRAMUSCULAR | Status: DC

## 2012-07-03 MED ORDER — TERBUTALINE SULFATE 1 MG/ML IJ SOLN: 0.2500 mg | Freq: Once | INTRAMUSCULAR | Status: AC | PRN

## 2012-07-03 MED ORDER — LACTATED RINGERS IV SOLN: 500.0000 mL | Freq: Once | INTRAVENOUS | Status: DC

## 2012-07-03 MED ORDER — FLEET ENEMA 7-19 GM/118ML RE ENEM: 1.0000 | ENEMA | RECTAL | Status: DC | PRN

## 2012-07-03 MED ORDER — DIPHENHYDRAMINE HCL 50 MG/ML IJ SOLN: 12.5000 mg | INTRAMUSCULAR | Status: DC | PRN

## 2012-07-03 MED ORDER — LACTATED RINGERS IV SOLN
500.0000 mL | INTRAVENOUS | Status: DC | PRN
  Administered 2012-07-03: 1000 mL via INTRAVENOUS

## 2012-07-03 NOTE — MAU Note (Signed)
Patient sent to be a direct admit to [facility] for elevated blood pressure. Room not available at this time and patient to MAU.

## 2012-07-03 NOTE — H&P (Signed)
Sheena Wagner is a 32 y.o. female [id number] at [redacted]w[redacted]d by 7 week ultrasound (not c/w LMP) presenting for Induction of Labor for preeclampsia. Patient presented to [facility] today with blood pressure 150/100 and 2+ proteinuria.  She has been on labetalol for past 2 weeks for high BP. She denies headache, vision changes, RUQ pain or edema. Baby is moving well. No vaginal bleeding or LOF. She is feeling contractions, but they are mild to moderate.  Her OB history is significant for one prior cesarean section at 40 weeks followed by a successful VBAC at 40 weeks and IUFD at 37 weeks with abruption, HELLP and DIC delivered vaginally.  Maternal Medical History:  Reason for admission: Reason for admission: nausea.    OB History    Grav Para Term Preterm Abortions TAB SAB Ect Mult Living   5 3   1  1   2      No past medical history on file. Past Surgical History  Procedure Date  . Cesarean section 2003   Family History: family history is not on file. Social History:  reports that she has never smoked. She does not have any smokeless tobacco history on file. She reports that she does not drink alcohol or use illicit drugs.   Prenatal Transfer Tool  Maternal Diabetes: No Genetic Screening: Declined Maternal Ultrasounds/Referrals: Normal Fetal Ultrasounds or other Referrals:  None Maternal Substance Abuse:  No Significant Maternal Medications:  Meds include: Other: Labetalol Significant Maternal Lab Results:  Lab values include: Group B Strep negative Other Comments:  None  Review of Systems  Constitutional: Negative for fever, chills and diaphoresis.  HENT: Positive for nosebleeds (2 to 3 times a week, with clots). Negative for congestion.   Eyes: Negative for blurred vision, double vision and photophobia.  Respiratory: Negative for shortness of breath.   Cardiovascular: Negative for chest pain and palpitations.  Gastrointestinal: Positive for nausea. Negative for vomiting, abdominal  pain and diarrhea.  Genitourinary: Negative for dysuria and urgency.  Neurological: Negative for dizziness and headaches.      Blood pressure 142/112, pulse 87, temperature 98.7 F (37.1 C), temperature source Oral, resp. rate 16, height 5' (1.524 m), weight 69.673 kg (153 lb 9.6 oz), last menstrual period 10/05/2011, SpO2 99.00%. Maternal Exam:  Uterine Assessment: Contraction strength is mild.  Abdomen: Surgical scars: low vertical midline.   Fetal presentation: vertex  Introitus: Normal vulva. Normal vagina.  Pelvis: adequate for delivery.   Cervix: Cervix evaluated by digital exam.     Fetal Exam Fetal Monitor Review: Mode: ultrasound.   Baseline rate: 140.  Variability: moderate (6-25 bpm).   Pattern: no accelerations, early decelerations and late decelerations.   No accelerations meeting 15x15 criteria. Two subtle late decels and one early. Good variability.  Fetal State Assessment: Category II - tracings are indeterminate.     Physical Exam  Constitutional: She is oriented to person, place, and time. She appears well-developed and well-nourished. No distress.  HENT:  Head: Normocephalic and atraumatic.  Eyes: Conjunctivae normal and EOM are normal.  Neck: Normal range of motion. Neck supple.  Cardiovascular: Normal rate, regular rhythm and normal heart sounds.   Respiratory: Effort normal and breath sounds normal. No respiratory distress.  GI: Soft. There is no tenderness. There is no rebound and no guarding.  Musculoskeletal: Normal range of motion. She exhibits no edema and no tenderness.  Neurological: She is alert and oriented to person, place, and time.  Skin: Skin is warm and dry.  Psychiatric:  She has a normal mood and affect.    Cervix:  1.5/40/-3  Prenatal labs: ABO, Rh: --/--/O POS (06/16 1212) Antibody:   Rubella:   RPR:    HBsAg:    HIV:    GBS:     Assessment/Plan: 32 y.o. .[id number] at [redacted]w[redacted]d with preeclampsia here for IOL.  - Admit to L&D -  PIH labs - Magnesium infusion, labetalol prn - Induction with foley bulb and pitocin  Napoleon Form 07/03/2012, 12:55 PM

## 2012-07-03 NOTE — Progress Notes (Signed)
Sheena Wagner is a 32 y.o. [id number] at [redacted]w[redacted]d by admitted for induction of labor due to Pre-eclampsia  Subjective: Patient feeling urge to push. Through Spanish interpreter, discussed needing to wait until complete, also discussed possibility of AROM, to which patient was amenable. Also asked whether she desired epidural, and she is still undecided.  Family at bedside and had no questions.  Objective: BP 146/88  Pulse 87  Temp 98.2 F (36.8 C) (Oral)  Resp 20  Ht 5' (1.524 m)  Wt 69.673 kg (153 lb 9.6 oz)  BMI 30.00 kg/m2  SpO2 100%  LMP 10/05/2011  Breastfeeding? No I/O last 3 completed shifts: In: 1151.2 [P.O.:440; I.V.:711.2] Out: 350 [Urine:150; Emesis/NG output:200] Total I/O In: 598.3 [P.O.:240; I.V.:358.3] Out: 1000 [Urine:1000]  FHT:  FHR: 135 bpm, variability: moderate,  accelerations:  Present,  decelerations:  Absent UC:   irregular, every 1-3 minutes SVE:   Dilation: 5 Effacement (%): 70;60 Station: -2 Exam by:: [employer] RN  Labs: Lab Results  Component Value Date   WBC 6.9 07/03/2012   HGB 13.8 07/03/2012   HCT 39.4 07/03/2012   MCV 86.6 07/03/2012   PLT 186 07/03/2012    Assessment / Plan: Induction of labor due to preeclampsia,  progressing well on pitocin  Labor: Progressing normally Preeclampsia:  on magnesium sulfate and no signs or symptoms of toxicity Fetal Wellbeing:  Category I Pain Control:  Labor support without medications Anticipated MOD:  NSVD  Palyn Scrima 07/03/2012, 10:32 PM

## 2012-07-03 NOTE — Progress Notes (Signed)
Spanish interpreter at bedside for admission and prior to starting magnesium sulfate to explain plan of care.  Also at bedside to explain pitocin augmentation, and options for pain control.   Questions answered.

## 2012-07-03 NOTE — Progress Notes (Signed)
Patient ID: Sheena Wagner, female   DOB: 05/10/1981, 32 y.o.   MRN: [id number]   S:  Pt is feeling some contractions already. No headache or vision changers  O:   Filed Vitals:   07/03/12 1449 07/03/12 1510 07/03/12 1516 07/03/12 1525  BP: 131/98 139/101 139/91 139/87  Pulse: 78 88 86 81  Temp:    98.1 F (36.7 C)  TempSrc:    Oral  Resp:  18 18 18   Height:      Weight:      SpO2:       Cervix:  2/50/-2  Foley bulb placed successfully  FHTs:  135, mod var, accels present, no decels TOCO:  q 3-5 minutes   A/P Will start low dose pitocin. Mag bolus completed. BP mild range, will monitor, labetalol prn  Napoleon Form, MD 07/03/2012 3:28 PM

## 2012-07-04 ENCOUNTER — Encounter (HOSPITAL_COMMUNITY): Payer: Self-pay | Admitting: Obstetrics

## 2012-07-04 DIAGNOSIS — IMO0002 Reserved for concepts with insufficient information to code with codable children: Secondary | ICD-10-CM

## 2012-07-04 DIAGNOSIS — O34219 Maternal care for unspecified type scar from previous cesarean delivery: Secondary | ICD-10-CM

## 2012-07-04 LAB — COMPREHENSIVE METABOLIC PANEL
Albumin: 2.3 g/dL — ABNORMAL LOW (ref 3.5–5.2)
BUN: 11 mg/dL (ref 6–23)
Chloride: 96 mEq/L (ref 96–112)
Creatinine, Ser: 0.59 mg/dL (ref 0.50–1.10)
Total Bilirubin: 0.3 mg/dL (ref 0.3–1.2)

## 2012-07-04 LAB — MRSA PCR SCREENING: MRSA by PCR: NEGATIVE

## 2012-07-04 LAB — CBC
HCT: 33.4 % — ABNORMAL LOW (ref 36.0–46.0)
MCHC: 34.7 g/dL (ref 30.0–36.0)
MCV: 86.3 fL (ref 78.0–100.0)
RDW: 12.8 % (ref 11.5–15.5)

## 2012-07-04 MED ORDER — LANOLIN HYDROUS EX OINT: TOPICAL_OINTMENT | CUTANEOUS | Status: DC | PRN

## 2012-07-04 MED ORDER — PRENATAL MULTIVITAMIN CH
1.0000 | ORAL_TABLET | Freq: Every day | ORAL | Status: DC
  Administered 2012-07-04 – 2012-07-06 (×3): 1 via ORAL
  Filled 2012-07-04 (×3): qty 1

## 2012-07-04 MED ORDER — ONDANSETRON HCL 4 MG/2ML IJ SOLN: 4.0000 mg | INTRAMUSCULAR | Status: DC | PRN

## 2012-07-04 MED ORDER — MISOPROSTOL 200 MCG PO TABS
800.0000 ug | ORAL_TABLET | Freq: Once | ORAL | Status: AC
  Administered 2012-07-04: 800 ug via RECTAL

## 2012-07-04 MED ORDER — BENZOCAINE-MENTHOL 20-0.5 % EX AERO
1.0000 "application " | INHALATION_SPRAY | CUTANEOUS | Status: DC | PRN
  Filled 2012-07-04: qty 56

## 2012-07-04 MED ORDER — IBUPROFEN 600 MG PO TABS
600.0000 mg | ORAL_TABLET | Freq: Four times a day (QID) | ORAL | Status: DC
  Administered 2012-07-04 – 2012-07-06 (×10): 600 mg via ORAL
  Filled 2012-07-04 (×10): qty 1

## 2012-07-04 MED ORDER — INFLUENZA VIRUS VACC SPLIT PF IM SUSP
0.5000 mL | INTRAMUSCULAR | Status: AC
  Administered 2012-07-05: 0.5 mL via INTRAMUSCULAR
  Filled 2012-07-04: qty 0.5

## 2012-07-04 MED ORDER — DIBUCAINE 1 % RE OINT: 1.0000 "application " | TOPICAL_OINTMENT | RECTAL | Status: DC | PRN

## 2012-07-04 MED ORDER — MISOPROSTOL 200 MCG PO TABS: 800.0000 ug | ORAL_TABLET | Freq: Once | ORAL | Status: DC

## 2012-07-04 MED ORDER — MISOPROSTOL 200 MCG PO TABS
ORAL_TABLET | ORAL | Status: AC
  Administered 2012-07-04: 800 ug
  Filled 2012-07-04: qty 4

## 2012-07-04 MED ORDER — LACTATED RINGERS IV SOLN
INTRAVENOUS | Status: DC
  Administered 2012-07-04 – 2012-07-05 (×2): via INTRAVENOUS

## 2012-07-04 MED ORDER — MISOPROSTOL 200 MCG PO TABS: 800.0000 ug | ORAL_TABLET | Freq: Once | ORAL | Status: AC

## 2012-07-04 MED ORDER — TETANUS-DIPHTH-ACELL PERTUSSIS 5-2.5-18.5 LF-MCG/0.5 IM SUSP
0.5000 mL | Freq: Once | INTRAMUSCULAR | Status: AC
  Administered 2012-07-05: 0.5 mL via INTRAMUSCULAR
  Filled 2012-07-04: qty 0.5

## 2012-07-04 MED ORDER — ZOLPIDEM TARTRATE 5 MG PO TABS: 5.0000 mg | ORAL_TABLET | Freq: Every evening | ORAL | Status: DC | PRN

## 2012-07-04 MED ORDER — OXYCODONE-ACETAMINOPHEN 5-325 MG PO TABS
1.0000 | ORAL_TABLET | ORAL | Status: DC | PRN
  Administered 2012-07-04: 1 via ORAL
  Filled 2012-07-04: qty 1

## 2012-07-04 MED ORDER — SIMETHICONE 80 MG PO CHEW: 80.0000 mg | CHEWABLE_TABLET | ORAL | Status: DC | PRN

## 2012-07-04 MED ORDER — MAGNESIUM SULFATE 40 G IN LACTATED RINGERS - SIMPLE
2.0000 g/h | INTRAVENOUS | Status: AC
  Filled 2012-07-04 (×2): qty 500

## 2012-07-04 MED ORDER — DIPHENHYDRAMINE HCL 25 MG PO CAPS: 25.0000 mg | ORAL_CAPSULE | Freq: Four times a day (QID) | ORAL | Status: DC | PRN

## 2012-07-04 MED ORDER — ONDANSETRON HCL 4 MG PO TABS: 4.0000 mg | ORAL_TABLET | ORAL | Status: DC | PRN

## 2012-07-04 MED ORDER — MISOPROSTOL 200 MCG PO TABS
ORAL_TABLET | ORAL | Status: AC
  Administered 2012-07-04: 04:00:00
  Filled 2012-07-04: qty 4

## 2012-07-04 MED ORDER — SENNOSIDES-DOCUSATE SODIUM 8.6-50 MG PO TABS
2.0000 | ORAL_TABLET | Freq: Every day | ORAL | Status: DC
  Administered 2012-07-04 – 2012-07-05 (×2): 2 via ORAL

## 2012-07-04 MED ORDER — WITCH HAZEL-GLYCERIN EX PADS: 1.0000 "application " | MEDICATED_PAD | CUTANEOUS | Status: DC | PRN

## 2012-07-04 MED ORDER — OXYTOCIN 40 UNITS IN LACTATED RINGERS INFUSION - SIMPLE MED: 62.5000 mL/h | INTRAVENOUS | Status: DC

## 2012-07-04 NOTE — Progress Notes (Signed)
UR completed 

## 2012-07-04 NOTE — Progress Notes (Signed)
   Sheena Wagner is a 32 y.o. [id number] at [redacted]w[redacted]d  admitted for induction of labor due to Pre-eclamptic toxemia of pregnancy..  Subjective: Pushing uncontrollably  Objective: BP 154/96  Pulse 80  Temp 98.4 F (36.9 C) (Oral)  Resp 20  Ht 5' (1.524 m)  Wt 153 lb 9.6 oz (69.673 kg)  BMI 30.00 kg/m2  SpO2 100%  LMP 10/05/2011  Breastfeeding? No Total I/O In: 917.7 [P.O.:300; I.V.:617.7] Out: 1400 [Urine:1400]  FHT:  FHR: 140 bpm, variability: moderate,  accelerations:  Abscent,  decelerations:  Absent UC:   regular, every 2 minutes SVE:   Dilation: 8 Effacement (%): 70;60 Station: -2 Exam by:: F Cresenzo CNM  AROM with clear fluid Labs: Lab Results  Component Value Date   WBC 6.9 07/03/2012   HGB 13.8 07/03/2012   HCT 39.4 07/03/2012   MCV 86.6 07/03/2012   PLT 186 07/03/2012    Assessment / Plan: Induction of labor due to preeclampsia,  progressing well on pitocin  Labor: Progressing normally Fetal Wellbeing:  Category I Pain Control:  Labor support without medications Anticipated MOD:  NSVD  CRESENZO-DISHMAN,Kadia Abaya 07/04/2012, 12:17 AM

## 2012-07-04 NOTE — Progress Notes (Signed)
Post Partum Day 0 Subjective: no complaints  Objective: Blood pressure 114/75, pulse 105, temperature 98.6 F (37 C), temperature source Oral, resp. rate 18, height 5' (1.524 m), weight 146 lb 11.2 oz (66.543 kg), last menstrual period 10/05/2011, SpO2 99.00%, not currently breastfeeding.  Physical Exam:  General: alert, cooperative and no distress Lochia: appropriate Uterine Fundus: firm DVT Evaluation: No evidence of DVT seen on physical exam.   Basename 07/04/12 0530 07/03/12 1400  HGB 11.6* 13.8  HCT 33.4* 39.4    Assessment/Plan: Contraception depo provera Bleeding improved Normal BP s/p preeclampsia   LOS: 1 day   Haward Pope 07/04/2012, 7:38 AM

## 2012-07-04 NOTE — Progress Notes (Signed)
Post Partum Day 0 Subjective: no complaints  Objective: Blood pressure 135/86, pulse 89, temperature 98.4 F (36.9 C), temperature source Oral, resp. rate 20, height 5' (1.524 m), weight 146 lb 11.2 oz (66.543 kg), last menstrual period 10/05/2011, SpO2 100.00%, not currently breastfeeding.  Physical Exam:  General: alert, cooperative and no distress Lochia: increased vaginal bleeding noted Uterine Fundus: firm Clots expressed DVT Evaluation: No evidence of DVT seen on physical exam.   Basename 07/03/12 1400  HGB 13.8  HCT 39.4    Assessment/Plan: Postpartum bleeding, treated with repeat dose of cytotec Normal BP following IOL preeclampsia   LOS: 1 day   ARNOLD,JAMES 07/04/2012, 4:14 AM

## 2012-07-04 NOTE — Progress Notes (Signed)
Interpreter at bedside to assist RN with Baby&Me book teaching. Flu & Tdap screening also. Questions answered that patient/husband had. Interpreter to come back and assist patient in ordering lunch.

## 2012-07-05 NOTE — Progress Notes (Signed)
Post Partum Day 1 Subjective: no complaints, up ad lib, voiding and tolerating PO She denies Headaches, visual disturbances, RUQ/epigastric pain, nausea or emesis  Objective: Blood pressure 123/90, pulse 88, temperature 97.2 F (36.2 C), temperature source Oral, resp. rate 16, height 5' (1.524 m), weight 148 lb 8 oz (67.359 kg), last menstrual period 10/05/2011, SpO2 99.00%, not currently breastfeeding.  Physical Exam:  General: alert, cooperative and no distress Lochia: appropriate Uterine Fundus: firm, NT DVT Evaluation: No evidence of DVT seen on physical exam. No cords or calf tenderness.   Basename 07/04/12 0530 07/03/12 1400  HGB 11.6* 13.8  HCT 33.4* 39.4    Assessment/Plan: Patient hemodynamically stable S/p magneisum sulfate secondary to preeclampsia x 9 hours - Patient desires discharge today. - Consider d/c home later today if remains stable   LOS: 2 days   Sheena Wagner 07/05/2012, 9:37 AM

## 2012-07-05 NOTE — Progress Notes (Signed)
Pt transferred to [id number] via w/c

## 2012-07-06 LAB — CBC
HCT: 28.7 % — ABNORMAL LOW (ref 36.0–46.0)
Platelets: 185 10*3/uL (ref 150–400)
RDW: 13.3 % (ref 11.5–15.5)
WBC: 8.2 10*3/uL (ref 4.0–10.5)

## 2012-07-06 LAB — TYPE AND SCREEN: Unit division: 0

## 2012-07-06 MED ORDER — IBUPROFEN 600 MG PO TABS: 600.0000 mg | ORAL_TABLET | Freq: Four times a day (QID) | ORAL | Status: DC | PRN

## 2012-07-06 NOTE — Discharge Summary (Signed)
Attestation of Attending Supervision of Advanced Practitioner (CNM/NP): Evaluation and management procedures were performed by the Advanced Practitioner under my supervision and collaboration.  I have reviewed the Advanced Practitioner's note and chart, and I agree with the management and plan.  Allister Lessley 07/06/2012 7:40 AM

## 2012-07-06 NOTE — Discharge Summary (Signed)
Obstetric Discharge Summary Reason for Admission: induction of labor d/t pre-e Prenatal Procedures: none Intrapartum Procedures: spontaneous vaginal delivery and successful vbac Postpartum Procedures: pph requiring cytotec Complications-Operative and Postpartum: hemorrhage Eating, drinking, voiding, ambulating well.  +flatus.  Lochia and pain wnl.  No complaints.   Hemoglobin  Date Value Range Status  07/04/2012 11.6* 12.0 - 15.0 g/dL Final     HCT  Date Value Range Status  07/04/2012 33.4* 36.0 - 46.0 % Final   S/P 24hrs pp magnesium Temp:  [97.2 F (36.2 C)-99.4 F (37.4 C)] 98.6 F (37 C) (01/26 0520) Pulse Rate:  [77-113] 97  (01/26 0520) Resp:  [16-20] 18  (01/26 0520) BP: (112-123)/(72-90) 112/77 mmHg (01/26 0520) SpO2:  [97 %-100 %] 98 % (01/26 0023)  Physical Exam:  General: alert, cooperative and no distress Lochia: appropriate Uterine Fundus: firm Incision: n/a DVT Evaluation: No evidence of DVT seen on physical exam. Negative Homan's sign. No cords or calf tenderness. No significant calf/ankle edema.  Discharge Diagnoses: Term Pregnancy-delivered, Preelampsia and successful VBAC  Discharge Information: Date: 07/06/2012 Activity: pelvic rest Diet: routine Medications: PNV and Ibuprofen Condition: stable Instructions: refer to practice specific booklet Discharge to: home Follow-up Information    Follow up with [facility]. Call on 07/07/2012. (to make an appointment for your postpartum visit in 4-6wks)    Contact information:   [address] [place] [address] [phone]         Newborn Data: Live born female  Birth Weight: 6 lb 7.4 oz (2931 g) APGAR: 8, 9  Home with mother. Breast and bottlefeeding, depo for contraception.  Reviewed pp pre-e warning s/s  Marge Duncans 07/06/2012, 7:06 AM

## 2014-01-27 ENCOUNTER — Ambulatory Visit (INDEPENDENT_AMBULATORY_CARE_PROVIDER_SITE_OTHER): Payer: Self-pay | Admitting: Women's Health

## 2014-01-27 ENCOUNTER — Encounter: Payer: Self-pay | Admitting: Women's Health

## 2014-01-27 VITALS — BP 124/76 | Wt 137.0 lb

## 2014-01-27 DIAGNOSIS — Z3009 Encounter for other general counseling and advice on contraception: Secondary | ICD-10-CM

## 2014-01-27 DIAGNOSIS — Z3202 Encounter for pregnancy test, result negative: Secondary | ICD-10-CM

## 2014-01-27 LAB — POCT URINE PREGNANCY: Preg Test, Ur: NEGATIVE

## 2014-01-27 MED ORDER — MEDROXYPROGESTERONE ACETATE 150 MG/ML IM SUSP
150.0000 mg | INTRAMUSCULAR | Status: DC
Start: 2014-01-27 — End: 2015-01-06

## 2014-01-27 NOTE — Patient Instructions (Signed)
Medroxyprogesterone injection [Contraceptive]- Depo Qu es este medicamento? Las inyecciones anticonceptivas de MEDROXIPROGESTERONA previenen Firefighter. Las [employer] brindarn control de la natalidad durante 3 meses. La Depo-subQ Provera 104 se utiliza tambin para tratar [employer] relacionado con endometriosis. Este medicamento puede ser utilizado para otros usos; si tiene alguna pregunta consulte con su proveedor de atencin mdica o con su farmacutico. MARCAS COMERCIALES DISPONIBLES: Depo-Provera, Depo-subQ Provera 104 Qu le debo informar a mi profesional de la salud antes de tomar este medicamento? Necesita saber si usted presenta alguno de los siguientes problemas o situaciones: -si consume alcohol con frecuencia -asma -enfermedad vascular o antecedente de cogulos sanguneos en los pulmones o las piernas -enfermedad de los [place], como osteoporosis -cncer de mama -diabetes -trastornos de la alimentacin (anorexia nerviosa o bulimia) -alta presin sangunea -infecciones por VIH o SIDA -enfermedad renal -enfermedad heptica -depresin mental -migraa -convulsiones -derrame cerebral -fuma tabaco -sangrado vaginal -una reaccin alrgica o inusual a la medroxiprogesterona, otras hormonas, otros medicamentos, alimentos, colorantes o conservantes -si est embarazada o buscando quedar embarazada -si est amamantando a un beb Cmo debo utilizar este medicamento? El anticonceptivo de Depo-Provera se inyecta por va intramuscular. La Depo-SubQ Provera 104 se inyecta por va subcutnea. Las [employer] un profesional de Radiographer, therapeutic. Usted no puede estar embarazada antes de recibir una inyeccin. La inyeccin normalmente se aplica durante los primeros 5 das despus de comenzar un perodo menstrual o 6 semanas despus de un parto. Hable con su pediatra para informarse acerca del uso de este medicamento en nios. Puede requerir atencin especial. Estas inyecciones han sido usadas en  nias que han empezados a tener perodos [place]. Sobredosis: Pngase en contacto inmediatamente con un centro toxicolgico o una sala de urgencia si usted cree que haya tomado demasiado medicamento. ATENCIN: [employer] es solo para usted. No comparta este medicamento con nadie. Qu sucede si me olvido de una dosis? Trate de no olvidar ninguna dosis. Para mantener el control de natalidad necesitar una inyeccin cada 3 meses. Si no puede asistir a una cita, comunquese con su profesional de la salud para que se la [place]. Si espera ms de 13 semanas entre las inyecciones anticonceptivos de Depo-Provera o ms de 14 semanas entre inyecciones anticonceptivos de Depo-subQ Provera 104, puede quedarse [place]. Si no puede asistir a su cita utilice otro mtodo anticonceptivo. Tal vez deba hacerse una prueba de embarazo antes de recibir otra inyeccin. Qu puede interactuar con este medicamento? No tome esta medicina con ninguno de los siguientes medicamentos: -bosentano Esta medicina tambin puede interactuar con los siguientes medicamentos: -aminoglutethimide -antibiticos o medicamentos para infecciones, especialmente rifampicina, rifabutina, rifapentina y griseofulvina -aprepitant -barbitricos, tales como el fenobarbital o primidona -bexaroteno -carbamazepina -medicamentos para convulsiones, tales como etotona, felbamato, [place], [place], topiramato -modafinilo -hierba de [address] ser que esta lista no menciona todas las posibles interacciones. Informe a su profesional de [employer] de [employer] productos a base de hierbas, medicamentos de [place] o suplementos nutritivos que est tomando. Si usted fuma, consume bebidas alcohlicas o si utiliza drogas ilegales, indqueselo tambin a su profesional de [employer]. Algunas sustancias pueden interactuar con su medicamento. A qu debo estar atento al usar [employer]? Este medicamento no la protege de la infeccin  por VIH (SIDA) ni de otras enfermedades de transmisin sexual. El uso de este producto puede provocar una prdida de calcio de sus huesos. La prdida de calcio puede provocar huesos dbiles (osteoporosis). Slo use este producto durante ms de 2 aos si otras  formas de anticonceptivos no son apropiados para usted. Mientre ms tiempo use este producto para el control de la natalidad, tendr ms riesgo de Marine scientistpadecer de NiSourcehuesos dbiles. Consulte a su profesional de Contractorla salud acerca de cmo puede Big Lotsmantener los huesos fuertes. Puede experimentar un cambio en el patrn de sangrado o periodos irregulares. Muchas mujeres dejan de tener periodos Goodyear Tiremientras usan este medicamento. Si recibe sus inyecciones a tiempo, la posibilidad de quedarse embarazada es muy baja. Si cree que podr AES Corporationestar embarazada, visite a su profesional de la salud lo antes posible. Si desea quedar embarazada dentro del prximo ao, informe a su profesional de Beazer Homesla salud. El Aristocrat Ranchettesefecto de este medicamento puede perdurar durante mucho tiempo despus de recibir su ltima inyeccin. Qu efectos secundarios puedo tener al Boston Scientificutilizar este medicamento? Efectos secundarios que debe informar a su mdico o a Producer, television/film/videosu profesional de la salud tan pronto como sea posible: -Therapist, artreacciones alrgicas como erupcin cutnea, picazn o urticarias, hinchazn de la cara, labios o lengua -secreciones o sensibilidad de las mamas -problemas respiratorios -cambios en la visin -depresin -sensacin de desmayos o mareos, cadas -fiebre -dolor en el abdomen, pecho, entrepierna o piernas -problemas de coordinacin, del habla, al caminar -cansancio o debilidad inusual -color amarillento de los ojos o la piel Efectos secundarios que, por lo general, no requieren Psychologist, prison and probation servicesatencin mdica (debe informarlos a mdico o a Producer, television/film/videosu profesional de la salud si persisten o si son molestos): -cne -retencin de lquidos e hinchazn -dolor de cabeza -perodos menstruales irregulares, manchando o ausencia de  perodos menstruales -dolor, picazn o reaccin cutnea temporal en el lugar de la inyeccin -aumento de peso Puede ser que esta lista no menciona todos los posibles efectos secundarios. Comunquese a su mdico por asesoramiento mdico Hewlett-Packardsobre los efectos secundarios. Usted puede informar los efectos secundarios a la FDA por telfono al 1-800-FDA-1088. Dnde debo guardar mi medicina? No se aplica en este caso. Un profesional de Associate Professorla salud le administrar las inyecciones. ATENCIN: Este folleto es un resumen. Puede ser que no cubra toda la posible informacin. Si usted tiene preguntas acerca de esta medicina, consulte con su mdico, su farmacutico o su profesional de Radiographer, therapeuticla salud.  2015, Elsevier/Gold Standard. (2008-08-09 15:09:00)

## 2014-01-27 NOTE — Progress Notes (Signed)
Patient ID: Sheena Wagner, female   DOB: 06/16/1980, 33 y.o.   MRN: [id number]    [facility]  Patient name: Sheena Wagner MRN [id number]  Date of birth: 08/16/1980  CC & HPI:  Sheena Wagner is a 33 y.o. Hispanic female presenting today to talk about contraception. She had baby last year, never came for pp visit, [facility], last injection was in November- was amenorrheic until few weeks ago when period started. Wants to get back on birth control before she becomes pregnant again. Last sex was on Sunday- no condom. [employer] interpreters used.   Pertinent History Reviewed:  Medical & Surgical Hx:   Past Medical History  Diagnosis Date  . History of stillbirth   . Hypertension   . History of HELLP syndrome, currently pregnant   . DIC (disseminated intravascular coagulation)     Last pregnancy  . Placental abruption     Last pregnancy   Past Surgical History  Procedure Laterality Date  . Cesarean section  2003   Medications: Reviewed & Updated - see associated section Social History: Reviewed -  reports that she has never smoked. She does not have any smokeless tobacco history on file.  Objective Findings:  Vitals: BP 124/76  Wt 137 lb (62.143 kg)  LMP 01/06/2014  Breastfeeding? No  Physical Examination: General appearance - alert, well appearing, and in no distress  Results for orders placed in visit on 01/27/14 (from the past 24 hour(s))  POCT URINE PREGNANCY   Collection Time    01/27/14 11:09 AM      Result Value Ref Range   Preg Test, Ur Negative       Assessment & Plan:  A:   Contraception counseling P:  Abstinence until depo  Rx depo w/ 3RF   F/U 8/26 for bhcg am, then depo pm   Marge DuncansBooker, Cassady Stanczak Randall CNM, [facility]BC 01/27/2014 12:21 PM

## 2014-02-03 ENCOUNTER — Encounter: Payer: Self-pay | Admitting: Adult Health

## 2014-02-03 ENCOUNTER — Ambulatory Visit (INDEPENDENT_AMBULATORY_CARE_PROVIDER_SITE_OTHER): Payer: Self-pay | Admitting: Adult Health

## 2014-02-03 ENCOUNTER — Other Ambulatory Visit: Payer: Self-pay

## 2014-02-03 DIAGNOSIS — Z3049 Encounter for surveillance of other contraceptives: Secondary | ICD-10-CM

## 2014-02-03 DIAGNOSIS — Z3042 Encounter for surveillance of injectable contraceptive: Secondary | ICD-10-CM

## 2014-02-03 DIAGNOSIS — Z3200 Encounter for pregnancy test, result unknown: Secondary | ICD-10-CM

## 2014-02-03 LAB — HCG, QUANTITATIVE, PREGNANCY: hCG, Beta Chain, Quant, S: <1 m[IU]/mL

## 2014-02-03 MED ORDER — MEDROXYPROGESTERONE ACETATE 150 MG/ML IM SUSP
150.0000 mg | Freq: Once | INTRAMUSCULAR | Status: AC
Start: 2014-02-03 — End: 2014-02-03
  Administered 2014-02-03: 150 mg via INTRAMUSCULAR

## 2014-04-12 ENCOUNTER — Encounter: Payer: Self-pay | Admitting: Adult Health

## 2014-04-28 ENCOUNTER — Ambulatory Visit: Payer: Self-pay

## 2014-04-28 ENCOUNTER — Encounter: Payer: Self-pay | Admitting: Obstetrics and Gynecology

## 2014-10-08 ENCOUNTER — Encounter: Payer: Self-pay | Admitting: Obstetrics and Gynecology

## 2014-10-08 ENCOUNTER — Ambulatory Visit: Payer: Self-pay | Admitting: Obstetrics and Gynecology

## 2015-01-06 ENCOUNTER — Encounter: Payer: Self-pay | Admitting: Advanced Practice Midwife

## 2015-01-06 ENCOUNTER — Ambulatory Visit (INDEPENDENT_AMBULATORY_CARE_PROVIDER_SITE_OTHER): Payer: BLUE CROSS/BLUE SHIELD | Admitting: Advanced Practice Midwife

## 2015-01-06 VITALS — BP 122/78 | HR 64 | Wt 142.0 lb

## 2015-01-06 DIAGNOSIS — N921 Excessive and frequent menstruation with irregular cycle: Secondary | ICD-10-CM | POA: Insufficient documentation

## 2015-01-06 DIAGNOSIS — N939 Abnormal uterine and vaginal bleeding, unspecified: Secondary | ICD-10-CM

## 2015-01-06 DIAGNOSIS — R42 Dizziness and giddiness: Secondary | ICD-10-CM

## 2015-01-06 LAB — POCT HEMOGLOBIN: HEMOGLOBIN: 12.6 g/dL (ref 12.2–16.2)

## 2015-01-06 MED ORDER — CIPROFLOXACIN HCL 500 MG PO TABS: ORAL_TABLET | ORAL | Status: DC

## 2015-01-06 MED ORDER — PNV PRENATAL PLUS MULTIVITAMIN 27-1 MG PO TABS: 1.0000 | ORAL_TABLET | Freq: Every day | ORAL | Status: DC

## 2015-01-06 NOTE — Progress Notes (Signed)
   Family [facility] Visit  Patient name: Sheena Wagner MRN [id number]  Date of birth: May 10, 1981  CC & HPI:  Sheena Wagner is a 34 y.o. Hispanic female presenting today for C/O vaginal bleeding, nearly daily, for 8 months.  Dyspareunia the day after intercourse for 2-3 months.  She last had depo 2/16 because she wants to get pregnant.  Blood ranges from red to black, light to heavy.  Feels dizzy sometimes. Pain is crampy in nature.   Pertinent History Reviewed:  Medical & Surgical Hx:   Past Medical History  Diagnosis Date  . History of stillbirth   . Hypertension   . History of HELLP syndrome, currently pregnant   . DIC (disseminated intravascular coagulation)     Last pregnancy  . Placental abruption     Last pregnancy   Past Surgical History  Procedure Laterality Date  . Cesarean section  2003   Family History  Problem Relation Age of Onset  . Hypertension Brother   . Hypertension Brother     Current outpatient prescriptions:  .  ciprofloxacin (CIPRO) 500 MG tablet, 1 PO BID X 7, Disp: 14 tablet, Rfl: 0 .  escitalopram (LEXAPRO) 5 MG tablet, Take 5 mg by mouth daily., Disp: , Rfl:  .  LISINOPRIL PO, Take by mouth daily., Disp: , Rfl:  .  Prenatal Vit-Fe Fumarate-FA (PNV PRENATAL PLUS MULTIVITAMIN) 27-1 MG TABS, Take 1 tablet by mouth daily., Disp: 30 tablet, Rfl: 11 Social History: Reviewed -  reports that she has never smoked. She has never used smokeless tobacco.  Review of Systems:   Constitutional: Negative for fever and chills Eyes: Negative for visual disturbances Respiratory: Negative for shortness of breath, dyspnea Cardiovascular: Negative for chest pain or palpitations  Gastrointestinal: Negative for vomiting, diarrhea and constipation; no abdominal pain Genitourinary: Negative for dysuria and urgency, vaginal irritation or itching Musculoskeletal: Negative for back pain, joint pain, myalgias  Neurological: Negative for dizziness and  headaches    Objective Findings:  Vitals: BP 122/78 mmHg  Pulse 64  Wt 142 lb (64.411 kg)  LMP 04/12/2014 (Approximate)  Physical Examination: General appearance - alert, well appearing, and in no distress Mental status - alert, oriented to person, place, and time Chest - normal respiratory effort Abdomen - some mild tenderness all across lower abdomen with deep palpation and bimanual Pelvic - SSE:  Small amount of blood coming from os.  CX non friable, No visible discharge  Assessment & Plan:  A:   AUB 2/2 depo  Pain r/t ? Subclinical infection P:  Samples of Ministrin for 2 months:  Hopefully, will cycle normally after stopping pills.  If not, ocnsider another month or two of COC's  Rx Cipro  BID X 7  Naprosyn  BID prn pain  Rx PNV    F/U if COC's don't stop bleeding or if starts bleeidng after stopping COC's. \[employer] iterpreter used   CRESENZO-DISHMAN,Carmine Carrozza CNM 01/06/2015 11:27 AM

## 2015-03-22 ENCOUNTER — Other Ambulatory Visit (HOSPITAL_COMMUNITY)
Admission: RE | Admit: 2015-03-22 | Discharge: 2015-03-22 | Disposition: A | Discharge status: Home / Self Care | Payer: BLUE CROSS/BLUE SHIELD | Source: Ambulatory Visit | Attending: Obstetrics & Gynecology | Admitting: Obstetrics & Gynecology

## 2015-03-22 ENCOUNTER — Ambulatory Visit (INDEPENDENT_AMBULATORY_CARE_PROVIDER_SITE_OTHER): Payer: BLUE CROSS/BLUE SHIELD | Admitting: Obstetrics & Gynecology

## 2015-03-22 ENCOUNTER — Encounter: Payer: Self-pay | Admitting: Obstetrics & Gynecology

## 2015-03-22 VITALS — BP 110/80 | HR 76 | Ht 60.0 in | Wt 141.0 lb

## 2015-03-22 DIAGNOSIS — Z1151 Encounter for screening for human papillomavirus (HPV): Secondary | ICD-10-CM | POA: Insufficient documentation

## 2015-03-22 DIAGNOSIS — Z01419 Encounter for gynecological examination (general) (routine) without abnormal findings: Secondary | ICD-10-CM | POA: Diagnosis present

## 2015-03-22 DIAGNOSIS — Z113 Encounter for screening for infections with a predominantly sexual mode of transmission: Secondary | ICD-10-CM | POA: Insufficient documentation

## 2015-03-22 DIAGNOSIS — N760 Acute vaginitis: Secondary | ICD-10-CM

## 2015-03-22 DIAGNOSIS — B9689 Other specified bacterial agents as the cause of diseases classified elsewhere: Secondary | ICD-10-CM

## 2015-03-22 MED ORDER — METRONIDAZOLE 0.75 % VA GEL: VAGINAL | Status: DC

## 2015-03-22 NOTE — Addendum Note (Signed)
Addended by: Richardson Chiquito on: 03/22/2015 10:43 AM   Modules accepted: Orders

## 2015-03-22 NOTE — Progress Notes (Signed)
Patient ID: Sheena Wagner, female   DOB: 04/24/1981, 34 y.o.   MRN: [id number] Subjective:     Sheena Wagner is a 34 y.o. female here for a routine exam.  Patient's last menstrual period was 03/01/2015. [id number] Birth Control Method:  none Menstrual Calendar(currently): regular now  Current complaints: none.   Current acute medical issues:  none   Recent Gynecologic History Patient's last menstrual period was 03/01/2015. Last Pap: 2015 ,  normal Last mammogram: ,    Past Medical History  Diagnosis Date  . History of stillbirth   . Hypertension   . History of HELLP syndrome, currently pregnant   . DIC (disseminated intravascular coagulation) (HCC)     Last pregnancy  . Placental abruption     Last pregnancy    Past Surgical History  Procedure Laterality Date  . Cesarean section  2003    OB History    Gravida Para Term Preterm AB TAB SAB Ectopic Multiple Living   Social History   Social History  . Marital Status: Single    Spouse Name: N/A  . Number of Children: N/A  . Years of Education: N/A   Social History Main Topics  . Smoking status: Never Smoker   . Smokeless tobacco: Never Used  . Alcohol Use: No  . Drug Use: No  . Sexual Activity: Yes    Birth Control/ Protection: None   Other Topics Concern  . None   Social History Narrative    Family History  Problem Relation Age of Onset  . Hypertension Brother   . Hypertension Brother      Current outpatient prescriptions:  .  ciprofloxacin (CIPRO) 500 MG tablet, 1 PO BID X 7 (Patient not taking: Reported on 03/22/2015), Disp: 14 tablet, Rfl: 0 .  escitalopram (LEXAPRO) 5 MG tablet, Take 5 mg by mouth daily., Disp: , Rfl:  .  LISINOPRIL PO, Take by mouth daily., Disp: , Rfl:  .  Prenatal Vit-Fe Fumarate-FA (PNV PRENATAL PLUS MULTIVITAMIN) 27-1 MG TABS, Take 1 tablet by mouth daily. (Patient not taking: Reported on 03/22/2015), Disp: 30 tablet, Rfl: 11  Review of  Systems  Review of Systems  Constitutional: Negative for fever, chills, weight loss, malaise/fatigue and diaphoresis.  HENT: Negative for hearing loss, ear pain, nosebleeds, congestion, sore throat, neck pain, tinnitus and ear discharge.   Eyes: Negative for blurred vision, double vision, photophobia, pain, discharge and redness.  Respiratory: Negative for cough, hemoptysis, sputum production, shortness of breath, wheezing and stridor.   Cardiovascular: Negative for chest pain, palpitations, orthopnea, claudication, leg swelling and PND.  Gastrointestinal: negative for abdominal pain. Negative for heartburn, nausea, vomiting, diarrhea, constipation, blood in stool and melena.  Genitourinary: Negative for dysuria, urgency, frequency, hematuria and flank pain.  Musculoskeletal: Negative for myalgias, back pain, joint pain and falls.  Skin: Negative for itching and rash.  Neurological: Negative for dizziness, tingling, tremors, sensory change, speech change, focal weakness, seizures, loss of consciousness, weakness and headaches.  Endo/Heme/Allergies: Negative for environmental allergies and polydipsia. Does not bruise/bleed easily.  Psychiatric/Behavioral: Negative for depression, suicidal ideas, hallucinations, memory loss and substance abuse. The patient is not nervous/anxious and does not have insomnia.        Objective:  Blood pressure 110/80, pulse 76, height 5' (1.524 m), weight 141 lb (63.957 kg), last menstrual period 03/01/2015.   Physical Exam  Vitals reviewed. Constitutional: She is oriented to person, place, and  time. She appears well-developed and well-nourished.  HENT:  Head: Normocephalic and atraumatic.        Right Ear: External ear normal.  Left Ear: External ear normal.  Nose: Nose normal.  Mouth/Throat: Oropharynx is clear and moist.  Eyes: Conjunctivae and EOM are normal. Pupils are equal, round, and reactive to light. Right eye exhibits no discharge. Left eye exhibits  no discharge. No scleral icterus.  Neck: Normal range of motion. Neck supple. No tracheal deviation present. No thyromegaly present.  Cardiovascular: Normal rate, regular rhythm, normal heart sounds and intact distal pulses.  Exam reveals no gallop and no friction rub.   No murmur heard. Respiratory: Effort normal and breath sounds normal. No respiratory distress. She has no wheezes. She has no rales. She exhibits no tenderness.  GI: Soft. Bowel sounds are normal. She exhibits no distension and no mass. There is no tenderness. There is no rebound and no guarding.  Genitourinary:  Breasts no masses skin changes or nipple changes bilaterally      Vulva is normal without lesions Vagina is pink moist without discharge Cervix normal in appearance and pap is done Uterus is normal size shape and contour Adnexa is negative with normal sized ovaries   Musculoskeletal: Normal range of motion. She exhibits no edema and no tenderness.  Neurological: She is alert and oriented to person, place, and time. She has normal reflexes. She displays normal reflexes. No cranial nerve deficit. She exhibits normal muscle tone. Coordination normal.  Skin: Skin is warm and dry. No rash noted. No erythema. No pallor.  Psychiatric: She has a normal mood and affect. Her behavior is normal. Judgment and thought content normal.       Assessment:    Healthy female exam.    Plan:    Contraception: none. Follow up in: 1 year. metrogel for malodorous discharge

## 2015-03-23 LAB — CYTOLOGY - PAP

## 2015-06-12 NOTE — L&D Delivery Note (Signed)
Delivery Note At 5:34 AM a viable female was delivered via VBAC, Spontaneous (Presentation: LOA ).  APGAR: 8, 9; weight 6+9.8. Cord clamped and cut by FOB; hospital cord blood sample collected. Placenta status: spont, intact .  Cord:  3 vessels  Anesthesia: None  Episiotomy: None Lacerations: None Est. Blood Loss (mL): 100  Mom to postpartum.  Baby to Couplet care / Skin to Skin.  Cam HaiSHAW, Jadelyn Elks CNM 05/26/2016, 5:49 AM

## 2015-06-21 ENCOUNTER — Encounter: Payer: Self-pay | Admitting: Advanced Practice Midwife

## 2015-06-21 ENCOUNTER — Other Ambulatory Visit: Payer: Self-pay | Admitting: Advanced Practice Midwife

## 2015-06-21 ENCOUNTER — Other Ambulatory Visit (INDEPENDENT_AMBULATORY_CARE_PROVIDER_SITE_OTHER): Payer: BLUE CROSS/BLUE SHIELD

## 2015-06-21 ENCOUNTER — Ambulatory Visit (INDEPENDENT_AMBULATORY_CARE_PROVIDER_SITE_OTHER): Payer: BLUE CROSS/BLUE SHIELD | Admitting: Advanced Practice Midwife

## 2015-06-21 VITALS — BP 120/80 | Ht 61.0 in | Wt 136.5 lb

## 2015-06-21 DIAGNOSIS — Z3200 Encounter for pregnancy test, result unknown: Secondary | ICD-10-CM

## 2015-06-21 DIAGNOSIS — O209 Hemorrhage in early pregnancy, unspecified: Secondary | ICD-10-CM | POA: Diagnosis not present

## 2015-06-21 DIAGNOSIS — O200 Threatened abortion: Secondary | ICD-10-CM | POA: Diagnosis not present

## 2015-06-21 DIAGNOSIS — Z029 Encounter for administrative examinations, unspecified: Secondary | ICD-10-CM

## 2015-06-21 DIAGNOSIS — Z3201 Encounter for pregnancy test, result positive: Secondary | ICD-10-CM | POA: Diagnosis not present

## 2015-06-21 LAB — POCT URINE PREGNANCY: Preg Test, Ur: POSITIVE — AB

## 2015-06-21 NOTE — Progress Notes (Signed)
   [facility]  Patient name: Sheena Wagner MRN [id number]  Date of birth: 05/18/1981  CC & HPI:  Sheena Wagner is a 35 y.o. Hispanic female presenting today for pelvic pain/bleeding.  She had light bleeding but cramps "like I was in Labor" starting Thursday.  She passed "what looks like a baby" on Friday, and bleeding picked up Saturday.  She passed a nother "baby" that day  UPT at home was neg. + here.  Desires pregnancy. Worried that working hard on Thursday at work caused this.   Pertinent History Reviewed:  Medical & Surgical Hx:   Past Medical History  Diagnosis Date  . History of stillbirth   . Hypertension   . History of HELLP syndrome, currently pregnant   . DIC (disseminated intravascular coagulation) (HCC)     Last pregnancy  . Placental abruption     Last pregnancy   Past Surgical History  Procedure Laterality Date  . Cesarean section  2003   Family History  Problem Relation Age of Onset  . Hypertension Brother   . Hypertension Brother     Current outpatient prescriptions:  .  escitalopram (LEXAPRO) 5 MG tablet, Take 5 mg by mouth daily., Disp: , Rfl:  .  LISINOPRIL PO, Take by mouth daily., Disp: , Rfl:  .  metroNIDAZOLE (METROGEL VAGINAL) 0.75 % vaginal gel, Nightly x 5 nights, Disp: 70 g, Rfl: 0 Social History: Reviewed -  reports that she has never smoked. She has never used smokeless tobacco.  Review of Systems:   Constitutional: Negative for fever and chills Eyes: Negative for visual disturbances Respiratory: Negative for shortness of breath, dyspnea Cardiovascular: Negative for chest pain or palpitations  Gastrointestinal: Negative for vomiting, diarrhea and constipation; no abdominal pain Genitourinary: Negative for dysuria and urgency, vaginal irritation or itching Musculoskeletal: Negative for back pain, joint pain, myalgias  Neurological: Negative for dizziness and headaches    Objective Findings:    Physical  Examination: General appearance - well appearing, and in no distress Mental status - alert, oriented to person, place, and time Chest:  Normal respiratory effort Heart - normal rate and regular rhythm Abdomen:  Soft, nontender Pelvic: SSE:  Small amount of dark blood.  Non tender to bimanual.  Cx FT outer os, closed inner os.  Musculoskeletal:  Normal range of motion without pain Extremities:  No edema   US shows empty uterus, no adnexal masses  Results for orders placed or performed in visit on 06/21/15 (from the past 24 hour(s))  POCT urine pregnancy   Collection Time: 06/21/15  8:53 AM  Result Value Ref Range   Preg Test, Ur Positive (A) Negative    US TA/TV: homogenous retroverted uterus,no IUP seen,EEC 5.693mm,normal ov's bilat,no free fluid or adnexal masses seen  Assessment & Plan:  A:   Probable SAB; ectopic precautions P:  Qhcg today, repeat 48 hours to make sure is declining    [place] interpreter used  No Follow-up on file.  CRESENZO-DISHMAN,Jalon Blackwelder CNM 06/21/2015 9:59 AM

## 2015-06-21 NOTE — Progress Notes (Signed)
US TA/TV: homogenous retroverted uterus,no IUP seen,EEC 5.233mm,normal ov's bilat,no free fluid or adnexal masses seen

## 2015-06-22 LAB — CBC
HEMATOCRIT: 37.9 % (ref 34.0–46.6)
Hemoglobin: 13.2 g/dL (ref 11.1–15.9)
MCH: 29.5 pg (ref 26.6–33.0)
MCHC: 34.8 g/dL (ref 31.5–35.7)
MCV: 85 fL (ref 79–97)
Platelets: 283 10*3/uL (ref 150–379)
RBC: 4.48 x10E6/uL (ref 3.77–5.28)
RDW: 12.8 % (ref 12.3–15.4)
WBC: 7.7 10*3/uL (ref 3.4–10.8)

## 2015-06-22 LAB — BETA HCG QUANT (REF LAB): hCG Quant: 721 m[IU]/mL

## 2015-06-24 ENCOUNTER — Other Ambulatory Visit: Payer: Self-pay | Admitting: Advanced Practice Midwife

## 2015-06-25 LAB — BETA HCG QUANT (REF LAB): hCG Quant: 189 m[IU]/mL

## 2015-06-27 ENCOUNTER — Ambulatory Visit: Payer: BLUE CROSS/BLUE SHIELD | Admitting: Obstetrics and Gynecology

## 2015-06-27 ENCOUNTER — Telehealth: Payer: Self-pay | Admitting: Obstetrics and Gynecology

## 2015-06-27 NOTE — Telephone Encounter (Signed)
Pt unable to keep appt this a.m.  Pt will call this a.m to office to reschedule appt.

## 2015-07-04 ENCOUNTER — Ambulatory Visit: Payer: BLUE CROSS/BLUE SHIELD | Admitting: Obstetrics and Gynecology

## 2015-08-02 ENCOUNTER — Ambulatory Visit (INDEPENDENT_AMBULATORY_CARE_PROVIDER_SITE_OTHER): Payer: BLUE CROSS/BLUE SHIELD | Admitting: Adult Health

## 2015-08-02 ENCOUNTER — Encounter: Payer: Self-pay | Admitting: Adult Health

## 2015-08-02 VITALS — BP 128/82 | HR 84 | Ht 61.0 in | Wt 140.0 lb

## 2015-08-02 DIAGNOSIS — N926 Irregular menstruation, unspecified: Secondary | ICD-10-CM | POA: Diagnosis not present

## 2015-08-02 DIAGNOSIS — K30 Functional dyspepsia: Secondary | ICD-10-CM

## 2015-08-02 DIAGNOSIS — R110 Nausea: Secondary | ICD-10-CM

## 2015-08-02 DIAGNOSIS — R109 Unspecified abdominal pain: Secondary | ICD-10-CM

## 2015-08-02 DIAGNOSIS — Z3202 Encounter for pregnancy test, result negative: Secondary | ICD-10-CM | POA: Diagnosis not present

## 2015-08-02 HISTORY — DX: Unspecified abdominal pain: R10.9

## 2015-08-02 HISTORY — DX: Nausea: R11.0

## 2015-08-02 HISTORY — DX: Irregular menstruation, unspecified: N92.6

## 2015-08-02 LAB — POCT URINE PREGNANCY: Preg Test, Ur: NEGATIVE

## 2015-08-02 NOTE — Patient Instructions (Signed)
Will check [facility] and talk in am

## 2015-08-02 NOTE — Progress Notes (Signed)
Subjective:     Patient ID: Sheena Wagner, female   DOB: 08-31-1980, 35 y.o.   MRN: [id number]  HPI Sheena Wagner is a 35 year old Hispanic female, married, G5P3 in for UPT has missed several periods and has had negative HPT, and has stomach pain for 3 days and mild nausea, no bleeding, or BM changes.She had miscarriage in January.Her blood type is O+.  Review of Systems Patient denies any headaches, hearing loss, fatigue, blurred vision, shortness of breath, chest pain, problems with bowel movements, urination, or intercourse. No joint pain or mood swings. See HPI for positives. Reviewed past medical,surgical, social and family history. Reviewed medications and allergies.     Objective:   Physical Exam BP 128/82 mmHg  Pulse 84  Ht  (1.549 m)  Wt 140 lb (63.504 kg)  BMI 26.47 kg/m2 UPT negative, Skin warm and dry. Neck: mid line trachea, normal thyroid, good ROM, no lymphadenopathy noted. Lungs: clear to ausculation bilaterally. Cardiovascular: regular rate and rhythm.Abdomen soft and mildly tender just below navel,No rebound, no HSM, will check QHCG now.    Assessment:     UPT negative  Missed periods Stomach pain Nausea     Plan:     Check [facility] Will talk in am, when labs back Ok to take tylenol for pain If pain increases go to ER

## 2015-08-03 ENCOUNTER — Ambulatory Visit: Payer: BLUE CROSS/BLUE SHIELD | Admitting: Obstetrics and Gynecology

## 2015-08-03 ENCOUNTER — Telehealth: Payer: Self-pay | Admitting: Adult Health

## 2015-08-03 LAB — BETA HCG QUANT (REF LAB): hCG Quant: <1 m[IU]/mL

## 2015-08-03 NOTE — Telephone Encounter (Signed)
Pt called stating that she would like to know the results of her blood work, pt states that she has been feeling dizzy and nausea. Pt speaks spanish only. Please contact pt

## 2015-08-03 NOTE — Telephone Encounter (Signed)
Pt's quant was negative. Pt speaks spanish only. Daisy to call pt with results. JSY

## 2015-08-15 ENCOUNTER — Ambulatory Visit: Payer: BLUE CROSS/BLUE SHIELD | Admitting: Adult Health

## 2015-08-17 ENCOUNTER — Ambulatory Visit (INDEPENDENT_AMBULATORY_CARE_PROVIDER_SITE_OTHER): Payer: BLUE CROSS/BLUE SHIELD | Admitting: Advanced Practice Midwife

## 2015-08-17 ENCOUNTER — Encounter: Payer: Self-pay | Admitting: Advanced Practice Midwife

## 2015-08-17 VITALS — BP 112/82 | HR 78 | Wt 140.0 lb

## 2015-08-17 DIAGNOSIS — R350 Frequency of micturition: Secondary | ICD-10-CM | POA: Diagnosis not present

## 2015-08-17 DIAGNOSIS — Z3200 Encounter for pregnancy test, result unknown: Secondary | ICD-10-CM

## 2015-08-17 LAB — POCT URINALYSIS DIPSTICK
GLUCOSE UA: NEGATIVE
Ketones, UA: NEGATIVE
Leukocytes, UA: NEGATIVE
Nitrite, UA: NEGATIVE
Protein, UA: NEGATIVE
RBC UA: NEGATIVE

## 2015-08-17 LAB — BETA HCG QUANT (REF LAB): hCG Quant: <1 m[IU]/mL

## 2015-08-17 NOTE — Progress Notes (Signed)
   [facility]  Patient name: Sheena Wagner MRN [id number]  Date of birth: 12/11/1980  CC & HPI:  Sheena Wagner is a 35 y.o. Hispanic female presenting   today for C/O + UPT (brought test here today), with neg UPT at [facility] yesterday. Also c/o urinary frequenvcy (large amounts), breast "tingling", some vaginal pressure.  Had SAB 1/21, and is not sure if she has had a period since.  Seen 3 weeks ago for similar complaints. Wants to be pregnant.   Pertinent History Reviewed:  Medical & Surgical Hx:   Past Medical History  Diagnosis Date  . History of stillbirth   . Hypertension   . History of HELLP syndrome, currently pregnant   . DIC (disseminated intravascular coagulation) (HCC)     Last pregnancy  . Placental abruption     Last pregnancy  . Missed periods 08/02/2015  . Stomach pain 08/02/2015  . Nausea 08/02/2015   Past Surgical History  Procedure Laterality Date  . Cesarean section  2003   Family History  Problem Relation Age of Onset  . Hypertension Brother   . Hypertension Brother    No current outpatient prescriptions on file. Social History: Reviewed -  reports that she has never smoked. She has never used smokeless tobacco.  Review of Systems:   Constitutional: Negative for fever and chills Eyes: Negative for visual disturbances Respiratory: Negative for shortness of breath, dyspnea Cardiovascular: Negative for chest pain or palpitations  Gastrointestinal: Negative for vomiting, diarrhea and constipation; no abdominal pain Genitourinary: Negative for dysuria and urgency, vaginal irritation or itching Musculoskeletal: Negative for back pain, joint pain, myalgias  Neurological: Negative for headaches    Objective Findings:    Physical Examination: General appearance - well appearing, and in no distress Mental status - alert, oriented to person, place, and time Chest:  Normal respiratory effort Heart - normal rate and regular  rhythm Breasts:  Normal, no rashes/erythema Abdomen:  Soft, nontender Pelvic: deferred Musculoskeletal:  Normal range of motion without pain Extremities:  No edema    Results for orders placed or performed in visit on 08/17/15 (from the past 24 hour(s))  POCT urinalysis dipstick   Collection Time: 08/17/15  9:16 AM  Result Value Ref Range   Color, UA yellow    Clarity, UA clear    Glucose, UA neg    Bilirubin, UA     Ketones, UA neg    Spec Grav, UA     Blood, UA neg    pH, UA     Protein, UA neg    Urobilinogen, UA     Nitrite, UA neg    Leukocytes, UA Negative Negative      Assessment & Plan:  A:  STAT HCG was negative  False home UPT; could be having pseudo-pregnancy symptoms P:  Pt left message about her neg quant; consider pg challenge to start period if it doesn't start soon    Return if symptoms worsen or fail to improve.  CRESENZO-DISHMAN,Criselda Starke CNM 08/17/2015 4:35 PM

## 2015-08-18 ENCOUNTER — Telehealth: Payer: Self-pay | Admitting: *Deleted

## 2015-08-18 NOTE — Telephone Encounter (Signed)
Pt informed of negative QHCG.

## 2015-08-19 ENCOUNTER — Ambulatory Visit: Payer: BLUE CROSS/BLUE SHIELD | Admitting: Adult Health

## 2015-09-21 ENCOUNTER — Other Ambulatory Visit (HOSPITAL_COMMUNITY): Payer: Self-pay | Admitting: Family Medicine

## 2015-09-21 ENCOUNTER — Other Ambulatory Visit (HOSPITAL_COMMUNITY): Payer: Self-pay | Admitting: *Deleted

## 2015-09-21 DIAGNOSIS — R102 Pelvic and perineal pain: Secondary | ICD-10-CM

## 2015-09-21 DIAGNOSIS — N949 Unspecified condition associated with female genital organs and menstrual cycle: Secondary | ICD-10-CM

## 2015-09-21 DIAGNOSIS — N941 Unspecified dyspareunia: Secondary | ICD-10-CM

## 2015-09-28 ENCOUNTER — Ambulatory Visit (HOSPITAL_COMMUNITY): Admission: RE | Admit: 2015-09-28 | Payer: BLUE CROSS/BLUE SHIELD | Source: Ambulatory Visit

## 2015-10-13 ENCOUNTER — Encounter: Payer: Self-pay | Admitting: Advanced Practice Midwife

## 2015-10-13 ENCOUNTER — Ambulatory Visit (INDEPENDENT_AMBULATORY_CARE_PROVIDER_SITE_OTHER): Payer: BLUE CROSS/BLUE SHIELD | Admitting: Advanced Practice Midwife

## 2015-10-13 VITALS — BP 130/80 | HR 64 | Wt 140.0 lb

## 2015-10-13 DIAGNOSIS — O09291 Supervision of pregnancy with other poor reproductive or obstetric history, first trimester: Secondary | ICD-10-CM

## 2015-10-13 DIAGNOSIS — O09299 Supervision of pregnancy with other poor reproductive or obstetric history, unspecified trimester: Secondary | ICD-10-CM | POA: Insufficient documentation

## 2015-10-13 DIAGNOSIS — Z3201 Encounter for pregnancy test, result positive: Secondary | ICD-10-CM

## 2015-10-13 DIAGNOSIS — N925 Other specified irregular menstruation: Secondary | ICD-10-CM

## 2015-10-13 DIAGNOSIS — Z3491 Encounter for supervision of normal pregnancy, unspecified, first trimester: Secondary | ICD-10-CM

## 2015-10-13 DIAGNOSIS — O34219 Maternal care for unspecified type scar from previous cesarean delivery: Secondary | ICD-10-CM | POA: Insufficient documentation

## 2015-10-13 LAB — POCT URINE PREGNANCY: PREG TEST UR: POSITIVE — AB

## 2015-10-13 MED ORDER — PNV PRENATAL PLUS MULTIVITAMIN 27-1 MG PO TABS: 1.0000 | ORAL_TABLET | Freq: Every day | ORAL | Status: DC

## 2015-10-13 NOTE — Progress Notes (Signed)
   [facility]  Patient name: Sheena BottcherMarta Wagner MRN [id number]  Date of birth: 07/10/1980  CC & HPI:  Sheena BottcherMarta Wagner is a 35 y.o. Hispanic female presenting today for + UPT.  She was seen 3/8 for + home UPT, but qhcg was negative.  She can't remember when her last period was, but doesn't think she has had one since before march  She has been taking weekly UPTs all of March and April, and was getting negative tests until this week. Started having some LBP yesterday.   Pertinent History Reviewed:  Medical & Surgical Hx:   Past Medical History  Diagnosis Date  . History of stillbirth   . History of HELLP syndrome, currently pregnant   . DIC (disseminated intravascular coagulation) (HCC)   . Placental abruption     Had preX, HELLP  . Missed periods 08/02/2015  . Stomach pain 08/02/2015  . Nausea 08/02/2015  . Hypertension     only w/pregnancy   Past Surgical History  Procedure Laterality Date  . Cesarean section  2003   Family History  Problem Relation Age of Onset  . Hypertension Brother   . Hypertension Brother     Current outpatient prescriptions:  .  Prenatal Vit-Fe Fumarate-FA (PNV PRENATAL PLUS MULTIVITAMIN) 27-1 MG TABS, Take 1 tablet by mouth daily., Disp: 30 tablet, Rfl: 11 Social History: Reviewed -  reports that she has never smoked. She has never used smokeless tobacco.  Review of Systems:   Constitutional: Negative for fever and chills Eyes: Negative for visual disturbances Respiratory: Negative for shortness of breath, dyspnea Cardiovascular: Negative for chest pain or palpitations  Gastrointestinal: Negative for vomiting, diarrhea and constipation; no abdominal pain Genitourinary: Negative for dysuria and urgency, vaginal irritation or itching Musculoskeletal: Negative for joint pain, myalgias  Neurological: Negative for dizziness and headaches    Objective Findings:    Physical Examination: General appearance - well appearing, and  in no distress Mental status - alert, oriented to person, place, and time Chest:  Normal respiratory effort Heart - normal rate and regular rhythm Abdomen:  Soft, nontender Musculoskeletal:  Normal range of motion without pain.  Area of back "pain" is c/w musculoskeltal, not exacerbated by palpation or movement. No CVAT, not crampy. Extremities:  No edema    Results for orders placed or performed in visit on 10/13/15 (from the past 24 hour(s))  POCT urine pregnancy   Collection Time: 10/13/15  8:41 AM  Result Value Ref Range   Preg Test, Ur Positive (A) Negative    50% or more of this visit was spent in counseling and coordination of care. [employer] Interpreter was used.  20 minutes of face to face time.   Assessment & Plan:  A:   Pregnancy, guessing aroung 4 weeks based on when she had a + UPT P:  rx prenatal vitamins w/11 RF   Return in about 2 weeks (around 10/27/2015) for dating ultrasound and new ob.  CRESENZO-DISHMAN,Keymoni Mccaster CNM 10/13/2015 11:41 AM

## 2015-10-26 ENCOUNTER — Other Ambulatory Visit: Payer: Self-pay | Admitting: Obstetrics & Gynecology

## 2015-10-26 DIAGNOSIS — O3680X0 Pregnancy with inconclusive fetal viability, not applicable or unspecified: Secondary | ICD-10-CM

## 2015-10-27 ENCOUNTER — Ambulatory Visit (INDEPENDENT_AMBULATORY_CARE_PROVIDER_SITE_OTHER): Payer: BLUE CROSS/BLUE SHIELD

## 2015-10-27 DIAGNOSIS — O3680X0 Pregnancy with inconclusive fetal viability, not applicable or unspecified: Secondary | ICD-10-CM | POA: Diagnosis not present

## 2015-10-27 DIAGNOSIS — Z3A09 9 weeks gestation of pregnancy: Secondary | ICD-10-CM | POA: Diagnosis not present

## 2015-10-27 NOTE — Progress Notes (Signed)
US 8+2 wks single IUP,pos fht 155 bpm,normal ov's bilat,crl 19.0 mm

## 2015-11-09 ENCOUNTER — Encounter: Payer: Self-pay | Admitting: Women's Health

## 2015-11-09 ENCOUNTER — Ambulatory Visit (INDEPENDENT_AMBULATORY_CARE_PROVIDER_SITE_OTHER): Payer: BLUE CROSS/BLUE SHIELD | Admitting: Women's Health

## 2015-11-09 VITALS — BP 118/60 | HR 80 | Wt 140.5 lb

## 2015-11-09 DIAGNOSIS — O34219 Maternal care for unspecified type scar from previous cesarean delivery: Secondary | ICD-10-CM

## 2015-11-09 DIAGNOSIS — Z3A11 11 weeks gestation of pregnancy: Secondary | ICD-10-CM

## 2015-11-09 DIAGNOSIS — Z331 Pregnant state, incidental: Secondary | ICD-10-CM

## 2015-11-09 DIAGNOSIS — O09291 Supervision of pregnancy with other poor reproductive or obstetric history, first trimester: Secondary | ICD-10-CM

## 2015-11-09 DIAGNOSIS — Z3490 Encounter for supervision of normal pregnancy, unspecified, unspecified trimester: Secondary | ICD-10-CM | POA: Insufficient documentation

## 2015-11-09 DIAGNOSIS — Z3491 Encounter for supervision of normal pregnancy, unspecified, first trimester: Secondary | ICD-10-CM

## 2015-11-09 DIAGNOSIS — Z369 Encounter for antenatal screening, unspecified: Secondary | ICD-10-CM

## 2015-11-09 DIAGNOSIS — O210 Mild hyperemesis gravidarum: Secondary | ICD-10-CM

## 2015-11-09 DIAGNOSIS — Z0283 Encounter for blood-alcohol and blood-drug test: Secondary | ICD-10-CM

## 2015-11-09 DIAGNOSIS — Z1389 Encounter for screening for other disorder: Secondary | ICD-10-CM

## 2015-11-09 DIAGNOSIS — Z3682 Encounter for antenatal screening for nuchal translucency: Secondary | ICD-10-CM

## 2015-11-09 DIAGNOSIS — R110 Nausea: Secondary | ICD-10-CM

## 2015-11-09 DIAGNOSIS — Z3481 Encounter for supervision of other normal pregnancy, first trimester: Secondary | ICD-10-CM

## 2015-11-09 DIAGNOSIS — Z862 Personal history of diseases of the blood and blood-forming organs and certain disorders involving the immune mechanism: Secondary | ICD-10-CM | POA: Insufficient documentation

## 2015-11-09 DIAGNOSIS — O09299 Supervision of pregnancy with other poor reproductive or obstetric history, unspecified trimester: Secondary | ICD-10-CM | POA: Insufficient documentation

## 2015-11-09 LAB — POCT URINALYSIS DIPSTICK
Glucose, UA: NEGATIVE
Ketones, UA: NEGATIVE
LEUKOCYTES UA: NEGATIVE
NITRITE UA: NEGATIVE
PROTEIN UA: NEGATIVE

## 2015-11-09 MED ORDER — PROMETHAZINE HCL 25 MG PO TABS: 12.5000 mg | ORAL_TABLET | Freq: Four times a day (QID) | ORAL | Status: DC | PRN

## 2015-11-09 NOTE — Progress Notes (Signed)
Subjective:  Sheena Wagner is a 35 y.o. [phone] Hispanic female at 10969w1d by 8wk u/s, being seen today for her first obstetrical visit.  Her obstetrical history is significant for (1) 40wk c/s in [place] d/t meconium stained fluid/no labor?, (2) Term uncomplicated [place], (3) 37wk IUFD d/t abruption>HELLP>DIC requiring 8uPRBC, 4uFFP, 3uPlatelets, PPH requiring Bakri balloon, (4) SAB x 2, (5) 38wk VBAC w/ pre-e.  Pregnancy history fully reviewed.  Patient reports went to ED w/ vb, has resolved now. Some white d/c w/ itching, has almost completely resolved but wants to have it checked out. Some nausea- requests meds. Denies vb, cramping, uti s/s, abnormal/malodorous vag d/c, or vulvovaginal itching/irritation.  BP 118/60 mmHg  Pulse 80  Wt 140 lb 8 oz (63.73 kg)  LMP  (LMP Unknown)  HISTORY: OB History  Gravida Para Term Preterm AB SAB TAB Ectopic Multiple Living  7 4 4  2 2    3     # Outcome Date GA Lbr Len/2nd Weight Sex Delivery Anes PTL Lv  7 Current           6 SAB 06/22/15          5 Term 07/04/12 3921w0d 02:19 / 00:08 6 lb 7.4 oz (2.931 kg) F VBAC None,Other  Y     Comments: preeclampsia  4 SAB 07/28/11          3 Term 11/04/10 2560w0d  5 lb 7 oz (2.466 kg) F VBAC   FD     Complications: Abruptio Placenta,HELLP syndrome     Comments: HELLP/DIC/PPH-Bakri balloon, required 8u PRBC, 4uFFP, 3u plt  2 Term 08/14/03 5733w0d   M VBAC   Y     Comments: home delivery- no weight of infant  1 Term 08/15/01 5033w0d  8 lb (3.629 kg) M CS-LTranv   Y     Comments: System Generated. Please review and update pregnancy details.     Past Medical History  Diagnosis Date  . History of stillbirth   . History of HELLP syndrome, currently pregnant   . DIC (disseminated intravascular coagulation) (HCC)   . Placental abruption     Had preX, HELLP  . Missed periods 08/02/2015  . Stomach pain 08/02/2015  . Nausea 08/02/2015  . Hypertension     only w/pregnancy   Past Surgical History   Procedure Laterality Date  . Cesarean section  2003   Family History  Problem Relation Age of Onset  . Hypertension Brother   . Hypertension Brother     Exam   System:     General: Well developed & nourished, no acute distress   Skin: Warm & dry, normal coloration and turgor, no rashes   Neurologic: Alert & oriented, normal mood   Cardiovascular: Regular rate & rhythm   Respiratory: Effort & rate normal, LCTAB, acyanotic   Abdomen: Soft, non tender   Extremities: normal strength, tone   Pelvic Exam:    Perineum: Normal perineum   Vulva: Normal, no lesions   Vagina:  Normal mucosa, normal discharge, wet prep neg, no evidence of any blood   Cervix: Normal, bulbous, appears closed   Uterus: Normal size/shape/contour for GA   Thin prep pap smear 03/22/15 neg w/ -HRHPV  FHR: 155 via doppler   Assessment:   Pregnancy: [id number] Patient Active Problem List   Diagnosis Date Noted  . Supervision of normal pregnancy 10/13/2015  . Hx of preeclampsia, prior pregnancy, currently pregnant 10/13/2015  . History of successful vaginal birth after  cesarean, currently pregnant 10/13/2015  . Missed periods 08/02/2015  . Stomach pain 08/02/2015  . Nausea 08/02/2015  . DIC (disseminated intravascular coagulation) (HCC)     [redacted]w[redacted]d [id number] New OB visit H/O c/s x1, then 3 VBAC H/O SAB x 2 H/O 37wk IUFD-abruption>HELLP>DIC>PPH H/O pre-e x 2 Nausea of pregnancy  Plan:  Initial labs drawn Continue prenatal vitamins Problem list reviewed and updated Reviewed n/v relief measures and warning s/s to report Rx phenergan for nausea Reviewed recommended weight gain based on pre-gravid BMI Encouraged well-balanced diet Genetic Screening discussed Integrated Screen: requested Cystic fibrosis screening discussed declined Ultrasound discussed; fetal survey: requested Follow up in 2 weeks for 1st it/nt and visit  CCNC completed Baby ASA @ 12wks Wants VBAC  Marge Duncans CNM,  [facility] 11/09/2015 5:19 PM

## 2015-11-09 NOTE — Patient Instructions (Addendum)
Nausea & Vomiting  Have saltine crackers or pretzels by your bed and eat a few bites before you raise your head out of bed in the morning  Eat small frequent meals throughout the day instead of large meals  Drink plenty of fluids throughout the day to stay hydrated, just don't drink a lot of fluids with your meals.  This can make your stomach fill up faster making you feel sick  Do not brush your teeth right after you eat  Products with real ginger are good for nausea, like ginger ale and ginger hard candy Make sure it says made with real ginger!  Sucking on sour candy like lemon heads is also good for nausea  If your prenatal vitamins make you nauseated, take them at night so you will sleep through the nausea  Sea Bands  If you feel like you need medicine for the nausea & vomiting please let us know  If you are unable to keep any fluids or food down please let us know   Begin taking a 81mg  baby aspirin daily at 12 weeks of pregnancy to decrease risk of preeclampsia during pregnancy  [name] (First Trimester of Pregnancy) El [name] se extiende desde la semana1 hasta el final de la semana12 (mes1 al mes3). Una semana despus de que un espermatozoide fecunda un vulo, este se implantar en la pared uterina. Este embrin comenzar a Camera operator convertirse en un beb. Sus genes y los de su pareja forman el beb. Los genes del varn determinan si ser un nio o una nia. Entre la semana6 y [place], se forman los ojos y [place], y los latidos del corazn pueden verse en la ecografa. Al final de las 12semanas, todos los rganos del beb estn formados.  Ahora que est embarazada, querr hacer todo lo que est a su alcance para tener un beb sano. Dos de las cosas ms importantes son Sheena Wagner buena atencin prenatal y seguir las indicaciones del mdico. La atencin prenatal incluye toda la asistencia mdica que usted recibe antes del  nacimiento del beb. Esta ayudar a prevenir, Engineer, manufacturing y tratar cualquier problema durante el embarazo y Sheena Wagner. CAMBIOS EN EL ORGANISMO Su organismo atraviesa por muchos cambios durante el [name], y estos varan de Sheena Wagner mujer a Sheena Wagner.  11. Al principio, puede aumentar o bajar algunos kilos. 12. Puede tener [name], broadcasting/film/video (nuseas) y vomitar. Si no puede controlar los vmitos, llame al mdico. 13. Puede cansarse con facilidad. 14. Es posible que tenga dolores de cabeza que pueden aliviarse con los medicamentos que el mdico le permita tomar. 15. Puede orinar con mayor frecuencia. El dolor al orinar puede significar que usted tiene una infeccin de la vejiga. 16. Debido al Sheena Wagner, puede tener acidez estomacal. 17. Puede estar estreida, ya que ciertas hormonas enlentecen los movimientos de los msculos que Sheena Wagner los desechos a travs de los intestinos. 18. Pueden aparecer hemorroides o abultarse e hincharse las venas (venas varicosas). 19. Las mamas pueden empezar a Sheena Wagner y Sheena Wagner. Los pezones pueden sobresalir ms, y el tejido que los rodea (areola) tornarse ms oscuro. 20. Las encas pueden sangrar y estar sensibles al cepillado y al hilo dental. 21. Pueden aparecer zonas oscuras o manchas (cloasma, [name]) en el rostro que probablemente se atenuarn despus del nacimiento del beb. 22. Los perodos menstruales se interrumpirn. 23. Tal vez no tenga apetito. 24. Puede sentir un fuerte deseo de consumir ciertos alimentos. 25. Puede tener  cambios a Sheena Wagner a da, por ejemplo, por momentos puede estar emocionada por el Sheena Wagner y por otros preocuparse porque algo pueda salir mal con el embarazo o el beb. 26. Tendr sueos ms vvidos y extraos. 27. Tal vez haya cambios en el cabello que pueden incluir su engrosamiento, crecimiento rpido y cambios en la textura. A algunas mujeres tambin se les cae el cabello durante o despus del Sheena Wagner, o  tienen el cabello seco o fino. Lo ms probable es que el cabello se le normalice despus del nacimiento del beb. [name] Durante una visita prenatal de rutina:  La pesarn para asegurarse de que usted y el beb estn creciendo normalmente.  Le controlarn la presin arterial.  Le medirn el abdomen para controlar el desarrollo del beb.  Se escucharn los latidos cardacos a partir de la semana10 o la12 de embarazo, aproximadamente.  Se analizarn los resultados de los estudios solicitados en visitas anteriores. El mdico puede preguntarle:  Cmo se siente.  Si siente los movimientos del beb.  Si ha tenido sntomas anormales, como prdida de lquido, Sheena Wagner, dolores de cabeza intensos o clicos abdominales.  Si est consumiendo algn producto que contenga tabaco, como cigarrillos, tabaco de Sheena Wagner y Administrator, Civil Service.  Si tiene Sheena Wagner. Otros estudios que pueden realizarse durante el primer trimestre incluyen lo siguiente:  Anlisis de sangre para determinar el tipo de sangre y Engineer, manufacturing la presencia de infecciones previas. Adems, se los usar para controlar si los niveles de hierro son bajos (anemia) y Chief Strategy Wagner los anticuerpos Rh. En una etapa ms avanzada del Sheena Wagner, se harn anlisis de sangre para saber si tiene diabetes, junto con otros estudios si surgen problemas.  Anlisis de orina para detectar infecciones, diabetes o protenas en la orina.  Una ecografa para confirmar que el beb crece y se desarrolla correctamente.  Una amniocentesis para diagnosticar posibles problemas genticos.  Estudios del feto para descartar espina bfida y sndrome de [place].  Es posible que necesite otras pruebas adicionales.  Prueba del VIH (virus de inmunodeficiencia humana). Los exmenes prenatales de rutina incluyen la prueba de deteccin del VIH, a menos que decida no Sheena Wagner. [name]    Medicamentos:  Siga las indicaciones del mdico en relacin con el uso de medicamentos. Durante el embarazo, hay medicamentos que pueden tomarse y otros que no.  Tome las vitaminas prenatales como se le indic.  Si est estreida, tome un laxante suave, si el mdico lo [place]. Dieta  Consuma alimentos balanceados. Elija alimentos variados, como carne o protenas de origen vegetal, pescado, leche y productos lcteos descremados, verduras, frutas y panes y Radiation protection practitioner. El mdico la ayudar a Production assistant, radio cantidad de peso que puede [place].  No coma carne cruda ni quesos sin cocinar. Estos elementos contienen bacterias que pueden causar defectos congnitos en el beb.  La ingesta diaria de cuatro o cinco comidas pequeas en lugar de tres comidas abundantes puede ayudar a [employer] nuseas y los vmitos. Si empieza a tener nuseas, comer algunas [address] puede ser de [place]. Beber lquidos National Wagner comidas en lugar de tomarlos durante las comidas tambin puede ayudar a Optician, dispensing las nuseas y los vmitos.  Si est estreida, consuma alimentos con alto contenido de [place], como verduras y frutas frescas, y Radiation protection practitioner. Beba suficiente lquido para Photographer orina clara o de color amarillo plido. Actividad y Landscape architect ejercicio solamente como se lo haya indicado el  mdico. El ejercicio la ayudar a:  Art gallery Wagner.  Mantenerse en forma.  Estar preparada para el trabajo de parto y [place].  Los dolores, los clicos en la parte baja del abdomen o los calambres en la cintura son un buen indicio de que debe dejar de Corporate treasurer. Consulte al mdico antes de seguir haciendo ejercicios normales.  Intente no estar de pie [employer]Ex. Mueva las piernas con frecuencia si debe estar de pie en un lugar durante mucho tiempo.  Evite levantar pesos [employer].  Use zapatos de tacones bajos y [place].  Puede seguir teniendo  [employer] [place] [employer], excepto que el mdico le indique lo contrario. Alivio del dolor o las molestias  Use un sostn que le brinde buen soporte si siente dolor a la palpacin [employer]el.  Dese baos de asiento con agua tibia para Engineer, materials o las molestias causadas por las hemorroides. Use crema antihemorroidal si el mdico se lo permite.  Descanse con las piernas elevadas si tiene calambres o dolor de cintura.  Si tiene venas varicosas en las piernas, use medias de descanso. Eleve los pies durante , 3 o 4veces por da. Limite la cantidad de sal en su dieta. Cuidados prenatales  Programe las visitas prenatales para la semana12 de [place]. Generalmente se programan cada mes al principio y se hacen ms frecuentes en los 2 ltimos meses antes del parto.  Escriba sus preguntas. Llvelas cuando concurra a las visitas prenatales.  Concurra a todas las visitas prenatales como se lo haya indicado el mdico. Seguridad  Colquese el cinturn de seguridad cuando conduzca.  Haga una lista de los nmeros de telfono de Associate Professor, que [employer] nmeros de telfono de familiares, [place], el hospital y los departamentos de polica y bomberos. Consejos generales  [place]dale al mdico que la derive a clases de educacin prenatal en su localidad. Debe comenzar a tomar las clases antes de Cytogeneticist en el mes6 de embarazo.  Pida ayuda si tiene necesidades nutricionales o de asesoramiento Academic librarian. El mdico puede aconsejarla o derivarla a especialistas para que la ayuden con diferentes necesidades.  No se d baos de inmersin en agua caliente, baos turcos ni saunas.  No se haga duchas vaginales ni use tampones o toallas higinicas perfumadas.  No mantenga las piernas cruzadas durante [place].  Evite el contacto con las bandejas sanitarias de los gatos y la tierra que estos animales usan. Estos elementos contienen bacterias que pueden causar defectos congnitos al beb  y la posible prdida del feto debido a un aborto espontneo o muerte fetal.  No fume, no consuma hierbas ni medicamentos que no hayan sido recetados por el mdico. Las sustancias qumicas que estos productos contienen afectan la formacin y el desarrollo del beb.  No consuma ningn producto que contenga tabaco, lo que incluye cigarrillos, tabaco de Sheena Wagner y Administrator, Civil Service. Si necesita ayuda para dejar de fumar, consulte al American Express. Puede recibir asesoramiento y otro tipo de recursos para dejar de fumar.  Programe una cita con el dentista. En su casa, lvese los dientes con un cepillo dental blando y psese el hilo dental con suavidad. SOLICITE ATENCIN MDICA SI:   Tiene mareos.  Siente clicos leves, presin en la pelvis o dolor persistente en el abdomen.  Tiene nuseas, vmitos o diarrea persistentes.  Tiene secrecin vaginal con mal olor.  Siente dolor al [employer]oco[employer]illips.  Tiene el rostro, las [place], las piernas o los tobillos ms hinchados. SOLICITE ATENCIN MDICA  DE INMEDIATO SI:   Tiene fiebre.  Tiene una prdida de lquido por la vagina.  Tiene sangrado o pequeas prdidas vaginales.  Siente dolor intenso o clicos en el abdomen.  Sube o baja de peso rpidamente.  Vomita sangre de color rojo brillante o material que parezca granos de caf.  Ha estado expuesta a la rubola y no ha sufrido la enfermedad.  Ha estado expuesta a la quinta enfermedad o a la varicela.  Tiene un dolor de cabeza intenso.  Le falta el aire.  Sufre cualquier tipo de traumatismo, por ejemplo, debido a una cada o un accidente automovilstico.   Esta informacin no tiene Theme park managercomo fin reemplazar el consejo del mdico. Asegrese de hacerle al mdico cualquier pregunta que tenga.   Document Released: 03/07/2005 Document Revised: 06/18/2014 Elsevier Interactive Patient Education [employer] Inc2016 Elsevier Inc.

## 2015-11-10 LAB — PMP SCREEN PROFILE (10S), URINE
AMPHETAMINE SCRN UR: NEGATIVE ng/mL
BARBITURATE SCRN UR: NEGATIVE ng/mL
Benzodiazepine Screen, Urine: NEGATIVE ng/mL
CANNABINOIDS UR QL SCN: NEGATIVE ng/mL
COCAINE(METAB.) SCREEN, URINE: NEGATIVE ng/mL
Creatinine(Crt), U: 25.8 mg/dL (ref 20.0–300.0)
Methadone Scn, Ur: NEGATIVE ng/mL
OPIATE SCRN UR: NEGATIVE ng/mL
Oxycodone+Oxymorphone Ur Ql Scn: NEGATIVE ng/mL
PCP Scrn, Ur: NEGATIVE ng/mL
Ph of Urine: 6.1 (ref 4.5–8.9)
Propoxyphene, Screen: NEGATIVE ng/mL

## 2015-11-10 LAB — CBC
Hematocrit: 39.2 % (ref 34.0–46.6)
Hemoglobin: 12.7 g/dL (ref 11.1–15.9)
MCH: 29.2 pg (ref 26.6–33.0)
MCHC: 32.4 g/dL (ref 31.5–35.7)
MCV: 90 fL (ref 79–97)
PLATELETS: 303 10*3/uL (ref 150–379)
RBC: 4.35 x10E6/uL (ref 3.77–5.28)
RDW: 13.9 % (ref 12.3–15.4)
WBC: 10.1 10*3/uL (ref 3.4–10.8)

## 2015-11-10 LAB — URINALYSIS, ROUTINE W REFLEX MICROSCOPIC
Bilirubin, UA: NEGATIVE
Glucose, UA: NEGATIVE
KETONES UA: NEGATIVE
Leukocytes, UA: NEGATIVE
NITRITE UA: NEGATIVE
Protein, UA: NEGATIVE
RBC UA: NEGATIVE
SPEC GRAV UA: 1.012 (ref 1.005–1.030)
UUROB: 0.2 mg/dL (ref 0.2–1.0)
pH, UA: 6.5 (ref 5.0–7.5)

## 2015-11-10 LAB — RPR: RPR: NONREACTIVE

## 2015-11-10 LAB — GC/CHLAMYDIA PROBE AMP
CHLAMYDIA, DNA PROBE: NEGATIVE
Neisseria gonorrhoeae by PCR: NEGATIVE

## 2015-11-10 LAB — HIV ANTIBODY (ROUTINE TESTING W REFLEX): HIV SCREEN 4TH GENERATION: NONREACTIVE

## 2015-11-10 LAB — HEPATITIS B SURFACE ANTIGEN: HEP B S AG: NEGATIVE

## 2015-11-10 LAB — ABO/RH: Rh Factor: POSITIVE

## 2015-11-10 LAB — RUBELLA SCREEN: Rubella Antibodies, IGG: 3.92 index (ref >0.99)

## 2015-11-10 LAB — VARICELLA ZOSTER ANTIBODY, IGG: VARICELLA: 3456 {index} (ref >165)

## 2015-11-10 LAB — SICKLE CELL SCREEN: SICKLE CELL SCREEN: NEGATIVE

## 2015-11-10 LAB — ANTIBODY SCREEN: ANTIBODY SCREEN: NEGATIVE

## 2015-11-11 LAB — URINE CULTURE: ORGANISM ID, BACTERIA: NO GROWTH

## 2015-11-23 ENCOUNTER — Other Ambulatory Visit: Payer: BLUE CROSS/BLUE SHIELD

## 2015-11-23 ENCOUNTER — Encounter: Payer: BLUE CROSS/BLUE SHIELD | Admitting: Women's Health

## 2015-11-30 ENCOUNTER — Ambulatory Visit (INDEPENDENT_AMBULATORY_CARE_PROVIDER_SITE_OTHER): Payer: BLUE CROSS/BLUE SHIELD | Admitting: Advanced Practice Midwife

## 2015-11-30 ENCOUNTER — Ambulatory Visit (INDEPENDENT_AMBULATORY_CARE_PROVIDER_SITE_OTHER): Payer: BLUE CROSS/BLUE SHIELD

## 2015-11-30 VITALS — BP 140/80 | HR 68 | Wt 140.0 lb

## 2015-11-30 DIAGNOSIS — Z3A14 14 weeks gestation of pregnancy: Secondary | ICD-10-CM | POA: Diagnosis not present

## 2015-11-30 DIAGNOSIS — Z331 Pregnant state, incidental: Secondary | ICD-10-CM

## 2015-11-30 DIAGNOSIS — Z3682 Encounter for antenatal screening for nuchal translucency: Secondary | ICD-10-CM

## 2015-11-30 DIAGNOSIS — Z3491 Encounter for supervision of normal pregnancy, unspecified, first trimester: Secondary | ICD-10-CM

## 2015-11-30 DIAGNOSIS — Z3482 Encounter for supervision of other normal pregnancy, second trimester: Secondary | ICD-10-CM

## 2015-11-30 DIAGNOSIS — Z3492 Encounter for supervision of normal pregnancy, unspecified, second trimester: Secondary | ICD-10-CM

## 2015-11-30 DIAGNOSIS — Z1389 Encounter for screening for other disorder: Secondary | ICD-10-CM

## 2015-11-30 DIAGNOSIS — Z36 Encounter for antenatal screening of mother: Secondary | ICD-10-CM

## 2015-11-30 LAB — POCT URINALYSIS DIPSTICK
GLUCOSE UA: NEGATIVE
Ketones, UA: NEGATIVE
Leukocytes, UA: NEGATIVE
Nitrite, UA: NEGATIVE
Protein, UA: NEGATIVE
RBC UA: NEGATIVE

## 2015-11-30 NOTE — Patient Instructions (Signed)
Stool Softener--2 every night   Fiber One

## 2015-11-30 NOTE — Progress Notes (Signed)
US 13+1 wks,measurements c/w dates,normal ov's bilat,crl 66.8 mm,NB present,NT 2 mm, fhr 154 bpm,ant pl gr 0

## 2015-11-30 NOTE — Progress Notes (Signed)
[id number] 5868w1d Estimated Date of Delivery: 06/05/16  Blood pressure 140/80, pulse 68, weight 140 lb (63.504 kg).   BP weight and urine results all reviewed and noted.  Please refer to the obstetrical flow sheet for the fundal height and fetal heart rate documentation: US 13+1 wks,measurements c/w dates,normal ov's bilat,crl 66.8 mm,NB present,NT 2 mm, fhr 154 bpm,ant pl gr 0           Patient denies any bleeding and no rupture of membranes symptoms or regular contractions. Patient is without complaints. All questions were answered.  Orders Placed This Encounter  Procedures  . Maternal Screen, Integrated #1  . POCT urinalysis dipstick    Plan:  Continued routine obstetrical care,   Return in about 4 weeks (around 12/28/2015) for LROB, 2nd IT.

## 2015-12-03 LAB — MATERNAL SCREEN, INTEGRATED #1
CROWN RUMP LENGTH MAT SCREEN: 66.8 mm
Gest. Age on Collection Date: 12.9 weeks
Maternal Age at EDD: 35.2 years
Nuchal Translucency (NT): 2 mm
Number of Fetuses: 1
PAPP-A Value: 611.9 ng/mL
Weight: 140 [lb_av]

## 2015-12-28 ENCOUNTER — Encounter: Payer: BLUE CROSS/BLUE SHIELD | Admitting: Women's Health

## 2015-12-28 ENCOUNTER — Encounter: Payer: Self-pay | Admitting: Women's Health

## 2016-01-10 DIAGNOSIS — Z3482 Encounter for supervision of other normal pregnancy, second trimester: Secondary | ICD-10-CM | POA: Diagnosis not present

## 2016-01-11 ENCOUNTER — Encounter: Payer: BLUE CROSS/BLUE SHIELD | Admitting: Women's Health

## 2016-01-18 ENCOUNTER — Ambulatory Visit (INDEPENDENT_AMBULATORY_CARE_PROVIDER_SITE_OTHER): Payer: Medicaid Other | Admitting: Advanced Practice Midwife

## 2016-01-18 ENCOUNTER — Encounter: Payer: Self-pay | Admitting: Advanced Practice Midwife

## 2016-01-18 VITALS — BP 118/78 | HR 76 | Wt 141.0 lb

## 2016-01-18 DIAGNOSIS — Z3A21 21 weeks gestation of pregnancy: Secondary | ICD-10-CM

## 2016-01-18 DIAGNOSIS — Z1389 Encounter for screening for other disorder: Secondary | ICD-10-CM | POA: Diagnosis not present

## 2016-01-18 DIAGNOSIS — Z363 Encounter for antenatal screening for malformations: Secondary | ICD-10-CM

## 2016-01-18 DIAGNOSIS — Z3482 Encounter for supervision of other normal pregnancy, second trimester: Secondary | ICD-10-CM

## 2016-01-18 DIAGNOSIS — Z331 Pregnant state, incidental: Secondary | ICD-10-CM | POA: Diagnosis not present

## 2016-01-18 DIAGNOSIS — Z3492 Encounter for supervision of normal pregnancy, unspecified, second trimester: Secondary | ICD-10-CM

## 2016-01-18 DIAGNOSIS — Z3682 Encounter for antenatal screening for nuchal translucency: Secondary | ICD-10-CM

## 2016-01-18 LAB — POCT URINALYSIS DIPSTICK
GLUCOSE UA: NEGATIVE
Ketones, UA: NEGATIVE
LEUKOCYTES UA: NEGATIVE
NITRITE UA: NEGATIVE
Protein, UA: NEGATIVE
RBC UA: NEGATIVE

## 2016-01-18 MED ORDER — ASPIRIN EC 81 MG PO TBEC: 81.0000 mg | DELAYED_RELEASE_TABLET | Freq: Every day | ORAL | 6 refills | Status: DC

## 2016-01-18 NOTE — Progress Notes (Signed)
[id number] 463w1d Estimated Date of Delivery: 06/05/16  Blood pressure 118/78, pulse 76, weight 141 lb (64 kg).    Pt missed last appt, so has not been scheduled for US yet.   BP weight and urine results all reviewed and noted.  Please refer to the obstetrical flow sheet for the fundal height and fetal heart rate documentation:  Patient reports good fetal movement, denies any bleeding and no rupture of membranes symptoms or regular contractions. Patient is without complaints. All questions were answered.  Orders Placed This Encounter  Procedures  . US OB Comp + 14 Wk  . POCT urinalysis dipstick    Plan:  Continued routine obstetrical care, 2nd IT today.    Return asap for anatomy scan only; 4 weeks LROB.

## 2016-01-20 LAB — MATERNAL SCREEN, INTEGRATED #2
AFP MARKER: 77.7 ng/mL
AFP MOM: 1.43
CROWN RUMP LENGTH: 66.8 mm
DIA MOM: 1.05
DIA Value: 220.3 pg/mL
Estriol, Unconjugated: <0.02 ng/mL
GESTATIONAL AGE: 19.9 wk
Gest. Age on Collection Date: 12.9 weeks
HCG MOM: 1.93
HCG VALUE: 43.5 [IU]/mL
MATERNAL AGE AT EDD: 35.2 a
NUCHAL TRANSLUCENCY MOM: 1.2
Nuchal Translucency (NT): 2 mm
Number of Fetuses: 1
PAPP-A MOM: 0.52
PAPP-A Value: 611.9 ng/mL
PDF: 0
Test Results:: NEGATIVE
WEIGHT: 140 [lb_av]
Weight: 140 [lb_av]

## 2016-01-23 ENCOUNTER — Ambulatory Visit (INDEPENDENT_AMBULATORY_CARE_PROVIDER_SITE_OTHER): Payer: BLUE CROSS/BLUE SHIELD

## 2016-01-23 DIAGNOSIS — Z3A21 21 weeks gestation of pregnancy: Secondary | ICD-10-CM | POA: Diagnosis not present

## 2016-01-23 DIAGNOSIS — Z36 Encounter for antenatal screening of mother: Secondary | ICD-10-CM

## 2016-01-23 DIAGNOSIS — Z363 Encounter for antenatal screening for malformations: Secondary | ICD-10-CM

## 2016-01-23 DIAGNOSIS — O321XX1 Maternal care for breech presentation, fetus 1: Secondary | ICD-10-CM | POA: Diagnosis not present

## 2016-01-23 DIAGNOSIS — Z3492 Encounter for supervision of normal pregnancy, unspecified, second trimester: Secondary | ICD-10-CM

## 2016-01-23 NOTE — Progress Notes (Signed)
US 20+6 wks,breech,cx 4.2 cm,ant fundal pl gr 0,normal ov's bilat,svp of fluid 6.6 cm,fhr 143 bpm,efw 386 g,anatomy complete no obvious abnormalities seen

## 2016-02-15 ENCOUNTER — Ambulatory Visit (INDEPENDENT_AMBULATORY_CARE_PROVIDER_SITE_OTHER): Payer: Medicaid Other | Admitting: Obstetrics and Gynecology

## 2016-02-15 VITALS — BP 128/82 | HR 95 | Wt 142.8 lb

## 2016-02-15 DIAGNOSIS — Z331 Pregnant state, incidental: Secondary | ICD-10-CM | POA: Diagnosis not present

## 2016-02-15 DIAGNOSIS — N9089 Other specified noninflammatory disorders of vulva and perineum: Secondary | ICD-10-CM | POA: Diagnosis not present

## 2016-02-15 DIAGNOSIS — Z1389 Encounter for screening for other disorder: Secondary | ICD-10-CM

## 2016-02-15 DIAGNOSIS — Z3A24 24 weeks gestation of pregnancy: Secondary | ICD-10-CM | POA: Diagnosis not present

## 2016-02-15 DIAGNOSIS — Z3490 Encounter for supervision of normal pregnancy, unspecified, unspecified trimester: Secondary | ICD-10-CM

## 2016-02-15 DIAGNOSIS — Z3482 Encounter for supervision of other normal pregnancy, second trimester: Secondary | ICD-10-CM

## 2016-02-15 DIAGNOSIS — M545 Low back pain: Secondary | ICD-10-CM

## 2016-02-15 LAB — POCT URINALYSIS DIPSTICK
GLUCOSE UA: NEGATIVE
Ketones, UA: NEGATIVE
Leukocytes, UA: NEGATIVE
NITRITE UA: NEGATIVE
PROTEIN UA: NEGATIVE
RBC UA: NEGATIVE

## 2016-02-15 MED ORDER — NYSTATIN-TRIAMCINOLONE 100000-0.1 UNIT/GM-% EX OINT: 1.0000 "application " | TOPICAL_OINTMENT | Freq: Two times a day (BID) | CUTANEOUS | 99 refills | Status: DC

## 2016-02-15 NOTE — Progress Notes (Signed)
Patient ID: Sheena BottcherMarta Wagner, female   DOB: 04/11/1981, 35 y.o.   MRN: [id number]  [id number]  Estimated Date of Delivery: 06/05/16 LROB 1553w1d  Blood pressure 128/82, pulse 95, weight 142 lb 12.8 oz (64.8 kg).    Urine results: notable for none  Chief Complaint  Patient presents with  . Routine Prenatal Visit    c/o lower back pain    Patient complaints: She states she occasionally has mild-to-moderate lower back pain while she is working. She denies dysuria, hematuria or lower abdominal cramping.   She also complains of multiple, itchy bumps on her labia.   She states her first child was delivered via C-section and she had 3 natural births following this delivery.   Patient reports good fetal movement. She denies any bleeding, rupture of membranes, or regular contractions.   Refer to the ob flow sheet for FH and FHR.    Physical Examination: General appearance - alert, well appearing, and in no distress                                      Abdomen -FHR 162 bpm                                                         soft, nontender, nondistended, no masses or organomegaly                                      Pelvic - VULVA: normal appearing vulva with no masses, tenderness or lesions, hard spot just above the clitoral hood that is thickened and fibrotic-1 cm x 1 cm in dimension, skin tag on the right labia majora      VAGINA: normal appearing vagina with normal color and discharge, no lesions                                            Questions were answered. Assessment: LROB [id number] @ 4653w1d  1. Vulvar lesion ?etiology 2. Back pain likely secondary to changes in multiparity   3. Discussed birth control methods; pt expresses interest in Nexplanon   Plan:   Continued routine obstetrical care Will prescribe hydrocortisone cream   F/u in 4 weeks for routine prenatal care, recheck of external genitalia    By signing my name below, I, Doreatha MartinEva Mathews, attest that this documentation  has been prepared under the direction and in the presence of Tilda BurrowJohn V Angelina Venard, MD. Electronically Signed: Doreatha MartinEva Mathews, ED Scribe. 02/15/16. 4:14 PM.  I personally performed the services described in this documentation, which was SCRIBED in my presence. The recorded information has been reviewed and considered accurate. It has been edited as necessary during review. Tilda BurrowFERGUSON,Jarriel Papillion V, MD

## 2016-03-02 ENCOUNTER — Telehealth: Payer: Self-pay | Admitting: Obstetrics & Gynecology

## 2016-03-02 NOTE — Telephone Encounter (Signed)
Pt called into the office stating that she has been having contractions every 5 minutes and has been having pressure. Spoke with Chrystal to get adivce, [facility] is only 26 weeks and she is having contractions. Let pt know what the nurse stated. Pt took advice and is on her way to [facility].

## 2016-03-05 ENCOUNTER — Telehealth: Payer: Self-pay | Admitting: Obstetrics & Gynecology

## 2016-03-05 MED ORDER — PROMETHAZINE HCL 25 MG PO TABS: 12.5000 mg | ORAL_TABLET | Freq: Four times a day (QID) | ORAL | 0 refills | Status: DC | PRN

## 2016-03-05 NOTE — Telephone Encounter (Signed)
I contacted pt and she is on her way up to pick up the note and I let her know if she starts getting any other symptoms ie; Vision or bad headache to please give us a call and we would schedule her an appointment.

## 2016-03-05 NOTE — Telephone Encounter (Signed)
Pt called stating that she left work because she was vomiting and is feeling very nauseated. Pt also states that she would like a letter stating the reason why she left early today.Please contact pt

## 2016-03-07 ENCOUNTER — Encounter: Payer: Self-pay | Admitting: Advanced Practice Midwife

## 2016-03-07 ENCOUNTER — Ambulatory Visit (INDEPENDENT_AMBULATORY_CARE_PROVIDER_SITE_OTHER): Payer: Medicaid Other | Admitting: Advanced Practice Midwife

## 2016-03-07 VITALS — BP 110/80 | HR 98 | Wt 145.5 lb

## 2016-03-07 DIAGNOSIS — Z1389 Encounter for screening for other disorder: Secondary | ICD-10-CM

## 2016-03-07 DIAGNOSIS — Z331 Pregnant state, incidental: Secondary | ICD-10-CM

## 2016-03-07 DIAGNOSIS — Z3A27 27 weeks gestation of pregnancy: Secondary | ICD-10-CM

## 2016-03-07 DIAGNOSIS — Z3482 Encounter for supervision of other normal pregnancy, second trimester: Secondary | ICD-10-CM | POA: Diagnosis not present

## 2016-03-07 DIAGNOSIS — O4702 False labor before 37 completed weeks of gestation, second trimester: Secondary | ICD-10-CM | POA: Diagnosis not present

## 2016-03-07 DIAGNOSIS — Z3492 Encounter for supervision of normal pregnancy, unspecified, second trimester: Secondary | ICD-10-CM

## 2016-03-07 LAB — POCT URINALYSIS DIPSTICK
Blood, UA: NEGATIVE
GLUCOSE UA: NEGATIVE
Ketones, UA: NEGATIVE
LEUKOCYTES UA: NEGATIVE
NITRITE UA: NEGATIVE
PROTEIN UA: NEGATIVE

## 2016-03-07 NOTE — Patient Instructions (Signed)

## 2016-03-07 NOTE — Progress Notes (Signed)
[id number] 5278w1d Estimated Date of Delivery: 06/05/16  Blood pressure 110/80, pulse 98, weight 145 lb 8 oz (66 kg).   BP weight and urine results all reviewed and noted.  Please refer to the obstetrical flow sheet for the fundal height and fetal heart rate documentation:  Patient reports good fetal movement, denies any bleeding Patient says shes been having off and on ctx for 2 weeks.  Had some watery dc from time to time.    SSE:  Scant normal appearing DC.  Neg fern/valsalva/pooking.  CX closed.  FFn collected.  All questions were answered.  Orders Placed This Encounter  Procedures  . Fetal fibronectin  . POCT urinalysis dipstick    Plan:  Continued routine obstetrical care, pn2  Return in about 3 weeks (around 03/28/2016) for PN2/LROB  (CANCEL 10/5).

## 2016-03-08 LAB — FETAL FIBRONECTIN: Fetal Fibronectin: NEGATIVE

## 2016-03-15 ENCOUNTER — Encounter: Payer: BLUE CROSS/BLUE SHIELD | Admitting: Advanced Practice Midwife

## 2016-03-28 ENCOUNTER — Other Ambulatory Visit: Payer: BLUE CROSS/BLUE SHIELD

## 2016-03-30 ENCOUNTER — Ambulatory Visit (INDEPENDENT_AMBULATORY_CARE_PROVIDER_SITE_OTHER): Payer: BLUE CROSS/BLUE SHIELD | Admitting: Obstetrics & Gynecology

## 2016-03-30 ENCOUNTER — Other Ambulatory Visit: Payer: BLUE CROSS/BLUE SHIELD

## 2016-03-30 ENCOUNTER — Encounter: Payer: Self-pay | Admitting: Obstetrics & Gynecology

## 2016-03-30 VITALS — BP 122/82 | HR 88 | Wt 146.0 lb

## 2016-03-30 DIAGNOSIS — Z3493 Encounter for supervision of normal pregnancy, unspecified, third trimester: Secondary | ICD-10-CM

## 2016-03-30 DIAGNOSIS — Z1389 Encounter for screening for other disorder: Secondary | ICD-10-CM

## 2016-03-30 DIAGNOSIS — Z131 Encounter for screening for diabetes mellitus: Secondary | ICD-10-CM

## 2016-03-30 DIAGNOSIS — Z3483 Encounter for supervision of other normal pregnancy, third trimester: Secondary | ICD-10-CM

## 2016-03-30 DIAGNOSIS — Z331 Pregnant state, incidental: Secondary | ICD-10-CM

## 2016-03-30 LAB — POCT URINALYSIS DIPSTICK
Blood, UA: NEGATIVE
GLUCOSE UA: NEGATIVE
Ketones, UA: NEGATIVE
LEUKOCYTES UA: NEGATIVE
NITRITE UA: NEGATIVE
PROTEIN UA: NEGATIVE

## 2016-03-30 NOTE — Progress Notes (Signed)
[id number] 318w3d Estimated Date of Delivery: 06/05/16  Blood pressure 122/82, pulse 88, weight 146 lb (66.2 kg).   BP weight and urine results all reviewed and noted.  Please refer to the obstetrical flow sheet for the fundal height and fetal heart rate documentation:  Patient reports good fetal movement, denies any bleeding and no rupture of membranes symptoms or regular contractions. Patient is without complaints. All questions were answered.  Orders Placed This Encounter  Procedures  . POCT urinalysis dipstick    Plan:  Continued routine obstetrical care, will sign VBAC papers next visit  No Follow-up on file.

## 2016-03-31 LAB — CBC
HEMATOCRIT: 38 % (ref 34.0–46.6)
Hemoglobin: 13.2 g/dL (ref 11.1–15.9)
MCH: 30.3 pg (ref 26.6–33.0)
MCHC: 34.7 g/dL (ref 31.5–35.7)
MCV: 87 fL (ref 79–97)
PLATELETS: 286 10*3/uL (ref 150–379)
RBC: 4.36 x10E6/uL (ref 3.77–5.28)
RDW: 12.5 % (ref 12.3–15.4)
WBC: 8.4 10*3/uL (ref 3.4–10.8)

## 2016-03-31 LAB — GLUCOSE TOLERANCE, 2 HOURS W/ 1HR
GLUCOSE, 1 HOUR: 171 mg/dL (ref 65–179)
GLUCOSE, FASTING: 84 mg/dL (ref 65–91)
Glucose, 2 hour: 118 mg/dL (ref 65–152)

## 2016-03-31 LAB — RPR: RPR Ser Ql: NONREACTIVE

## 2016-03-31 LAB — ANTIBODY SCREEN: Antibody Screen: NEGATIVE

## 2016-03-31 LAB — HIV ANTIBODY (ROUTINE TESTING W REFLEX): HIV SCREEN 4TH GENERATION: NONREACTIVE

## 2016-04-12 ENCOUNTER — Ambulatory Visit (INDEPENDENT_AMBULATORY_CARE_PROVIDER_SITE_OTHER): Payer: Managed Care, Other (non HMO) | Admitting: Obstetrics and Gynecology

## 2016-04-12 VITALS — BP 118/70 | HR 70 | Wt 146.4 lb

## 2016-04-12 DIAGNOSIS — Z1389 Encounter for screening for other disorder: Secondary | ICD-10-CM

## 2016-04-12 DIAGNOSIS — Z3493 Encounter for supervision of normal pregnancy, unspecified, third trimester: Secondary | ICD-10-CM

## 2016-04-12 DIAGNOSIS — Z331 Pregnant state, incidental: Secondary | ICD-10-CM

## 2016-04-12 DIAGNOSIS — Z3A32 32 weeks gestation of pregnancy: Secondary | ICD-10-CM

## 2016-04-12 LAB — POCT URINALYSIS DIPSTICK
Blood, UA: NEGATIVE
Glucose, UA: NEGATIVE
KETONES UA: NEGATIVE
Leukocytes, UA: NEGATIVE
Nitrite, UA: NEGATIVE
PROTEIN UA: NEGATIVE

## 2016-04-12 NOTE — Progress Notes (Signed)
Patient ID: Sheena Wagner, female   DOB: 02/09/1981, 35 y.o.   MRN: [id number]   [id number] 1216w2d Estimated Date of Delivery: 06/05/16 LROB  Patient reports good fetal movement, denies any bleeding and no rupture of membranes symptoms or regular contractions. Patient complaints: pain and swelling to bilateral hands primarily in the morning. 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 Pt reports that her bilateral hand pain is worsened with being still and alleviated with movement of her bilateral hands. Pt states that she has had a past C-section and she plans to have another C-section. Pt has not tried any medications for the relief of her symptoms. Pt denies any other symptoms at this time. Pt states that her plans for contraception following pregnancy is the depo provera.  Blood pressure 118/70, pulse 70, weight 146 lb 6.4 oz (66.4 kg). refer to the ob flow sheet for FH and FHR, also BP, Wt, Urine results:notable for negative                          Physical Examination:  General appearance - alert, well appearing, and in no distress Abdomen - FH 33 cm                   -FHR 137 soft, nontender, nondistended, no masses or organomegaly                             Questions were answered. Assessment: LROB [id number] @ 516w2d  Plan:  Continued routine obstetrical care  F/u in 2 weeks for routine OB  By signing my name below, I, Soijett Blue, attest that this documentation has been prepared under the direction and in the presence of Tilda BurrowJohn V Shikira Folino, MD. Electronically Signed: Soijett Blue, ED Scribe. 04/12/16. 4:36 PM.  I personally performed the services described in this documentation, which was SCRIBED in my presence. The recorded information has been reviewed and considered accurate. It has been edited as necessary during review. Tilda BurrowFERGUSON,Tayshun Gappa V, MD

## 2016-04-26 ENCOUNTER — Ambulatory Visit (INDEPENDENT_AMBULATORY_CARE_PROVIDER_SITE_OTHER): Payer: Managed Care, Other (non HMO) | Admitting: Advanced Practice Midwife

## 2016-04-26 VITALS — BP 120/80 | HR 84 | Wt 150.0 lb

## 2016-04-26 DIAGNOSIS — Z3A34 34 weeks gestation of pregnancy: Secondary | ICD-10-CM

## 2016-04-26 DIAGNOSIS — Z1389 Encounter for screening for other disorder: Secondary | ICD-10-CM

## 2016-04-26 DIAGNOSIS — Z331 Pregnant state, incidental: Secondary | ICD-10-CM

## 2016-04-26 DIAGNOSIS — Z3483 Encounter for supervision of other normal pregnancy, third trimester: Secondary | ICD-10-CM

## 2016-04-26 LAB — POCT URINALYSIS DIPSTICK
Glucose, UA: NEGATIVE
Ketones, UA: NEGATIVE
Leukocytes, UA: NEGATIVE
NITRITE UA: NEGATIVE
PROTEIN UA: NEGATIVE
RBC UA: NEGATIVE

## 2016-04-26 NOTE — Progress Notes (Signed)
[id number] 5435w2d Estimated Date of Delivery: 06/05/16  Blood pressure 120/80, pulse 84, weight 150 lb (68 kg).   BP weight and urine results all reviewed and noted.  Please refer to the obstetrical flow sheet for the fundal height and fetal heart rate documentation:  Patient reports good fetal movement, denies any bleeding and no rupture of membranes symptoms or regular contractions. Patient is without complaints. Last note said pt wanted a CS but that was a typo.  All questions were answered.  No orders of the defined types were placed in this encounter.   Plan:  Continued routine obstetrical care, VBAC consent form sent home to read . Will signe next week  Return in about 2 weeks (around 05/10/2016) for LROB.

## 2016-05-10 ENCOUNTER — Encounter: Payer: Self-pay | Admitting: Obstetrics & Gynecology

## 2016-05-10 ENCOUNTER — Ambulatory Visit (INDEPENDENT_AMBULATORY_CARE_PROVIDER_SITE_OTHER): Payer: Managed Care, Other (non HMO) | Admitting: Obstetrics & Gynecology

## 2016-05-10 VITALS — BP 128/80 | HR 62 | Wt 152.5 lb

## 2016-05-10 DIAGNOSIS — Z1389 Encounter for screening for other disorder: Secondary | ICD-10-CM

## 2016-05-10 DIAGNOSIS — Z3483 Encounter for supervision of other normal pregnancy, third trimester: Secondary | ICD-10-CM

## 2016-05-10 DIAGNOSIS — Z3A36 36 weeks gestation of pregnancy: Secondary | ICD-10-CM

## 2016-05-10 DIAGNOSIS — Z3493 Encounter for supervision of normal pregnancy, unspecified, third trimester: Secondary | ICD-10-CM

## 2016-05-10 DIAGNOSIS — Z331 Pregnant state, incidental: Secondary | ICD-10-CM

## 2016-05-10 LAB — POCT URINALYSIS DIPSTICK
Blood, UA: NEGATIVE
Glucose, UA: NEGATIVE
Ketones, UA: NEGATIVE
LEUKOCYTES UA: NEGATIVE
Nitrite, UA: NEGATIVE
PROTEIN UA: NEGATIVE

## 2016-05-10 NOTE — Progress Notes (Signed)
[id number] 2136w2d Estimated Date of Delivery: 06/05/16  Blood pressure 128/80, pulse 62, weight 152 lb 8 oz (69.2 kg).   BP weight and urine results all reviewed and noted.  Please refer to the obstetrical flow sheet for the fundal height and fetal heart rate documentation:  Patient reports good fetal movement, denies any bleeding and no rupture of membranes symptoms or regular contractions. Patient is without complaints. All questions were answered.  Orders Placed This Encounter  Procedures  . POCT urinalysis dipstick    Plan:  Continued routine obstetrical care,   Return in about 1 week (around 05/17/2016) for LROB, GBS.

## 2016-05-15 ENCOUNTER — Encounter: Payer: Self-pay | Admitting: Advanced Practice Midwife

## 2016-05-15 ENCOUNTER — Ambulatory Visit (INDEPENDENT_AMBULATORY_CARE_PROVIDER_SITE_OTHER): Payer: Managed Care, Other (non HMO) | Admitting: Advanced Practice Midwife

## 2016-05-15 VITALS — BP 126/84 | HR 72 | Wt 152.0 lb

## 2016-05-15 DIAGNOSIS — Z3685 Encounter for antenatal screening for Streptococcus B: Secondary | ICD-10-CM

## 2016-05-15 DIAGNOSIS — Z3483 Encounter for supervision of other normal pregnancy, third trimester: Secondary | ICD-10-CM

## 2016-05-15 DIAGNOSIS — Z3A37 37 weeks gestation of pregnancy: Secondary | ICD-10-CM

## 2016-05-15 DIAGNOSIS — Z1389 Encounter for screening for other disorder: Secondary | ICD-10-CM

## 2016-05-15 DIAGNOSIS — Z331 Pregnant state, incidental: Secondary | ICD-10-CM

## 2016-05-15 LAB — POCT URINALYSIS DIPSTICK
Blood, UA: NEGATIVE
GLUCOSE UA: NEGATIVE
Ketones, UA: NEGATIVE
LEUKOCYTES UA: NEGATIVE
Nitrite, UA: NEGATIVE
Protein, UA: NEGATIVE

## 2016-05-15 NOTE — Progress Notes (Signed)
[id number] 842w0d Estimated Date of Delivery: 06/05/16  Blood pressure 126/84, pulse 72, weight 152 lb (68.9 kg).   BP weight and urine results all reviewed and noted.  Please refer to the obstetrical flow sheet for the fundal height and fetal heart rate documentation:  Patient reports good fetal movement, denies any bleeding and no rupture of membranes symptoms or regular contractions. Patient is without complaints. All questions were answered.  Orders Placed This Encounter  Procedures  . GC/Chlamydia Probe Amp  . Culture, beta strep (group b only)  . POCT urinalysis dipstick    Plan:  Continued routine obstetrical care,  Return in about 1 week (around 05/22/2016) for LROB.

## 2016-05-17 LAB — GC/CHLAMYDIA PROBE AMP
Chlamydia trachomatis, NAA: NEGATIVE
Neisseria gonorrhoeae by PCR: NEGATIVE

## 2016-05-18 ENCOUNTER — Encounter: Payer: Self-pay | Admitting: Women's Health

## 2016-05-18 ENCOUNTER — Ambulatory Visit (INDEPENDENT_AMBULATORY_CARE_PROVIDER_SITE_OTHER): Payer: Managed Care, Other (non HMO) | Admitting: Women's Health

## 2016-05-18 VITALS — BP 124/82 | HR 80 | Wt 152.0 lb

## 2016-05-18 DIAGNOSIS — Z3483 Encounter for supervision of other normal pregnancy, third trimester: Secondary | ICD-10-CM

## 2016-05-18 DIAGNOSIS — Z3A38 38 weeks gestation of pregnancy: Secondary | ICD-10-CM

## 2016-05-18 DIAGNOSIS — Z1389 Encounter for screening for other disorder: Secondary | ICD-10-CM

## 2016-05-18 DIAGNOSIS — Z3685 Encounter for antenatal screening for Streptococcus B: Secondary | ICD-10-CM

## 2016-05-18 DIAGNOSIS — O34219 Maternal care for unspecified type scar from previous cesarean delivery: Secondary | ICD-10-CM

## 2016-05-18 DIAGNOSIS — Z331 Pregnant state, incidental: Secondary | ICD-10-CM

## 2016-05-18 DIAGNOSIS — O09299 Supervision of pregnancy with other poor reproductive or obstetric history, unspecified trimester: Secondary | ICD-10-CM

## 2016-05-18 LAB — POCT URINALYSIS DIPSTICK
GLUCOSE UA: NEGATIVE
Ketones, UA: NEGATIVE
Leukocytes, UA: NEGATIVE
NITRITE UA: NEGATIVE
PROTEIN UA: NEGATIVE

## 2016-05-18 LAB — OB RESULTS CONSOLE GBS: STREP GROUP B AG: POSITIVE

## 2016-05-18 LAB — CULTURE, BETA STREP (GROUP B ONLY): Strep Gp B Culture: POSITIVE — AB

## 2016-05-18 NOTE — Patient Instructions (Signed)
Call the office [phone]) or go to [facility] if:  You begin to have strong, frequent contractions  Your water breaks.  Sometimes it is a big gush of fluid, sometimes it is just a trickle that keeps getting your panties wet or running down your legs  You have vaginal bleeding.  It is normal to have a small amount of spotting if your cervix was checked.   You don't feel your baby moving like normal.  If you don't, get you something to eat and drink and lay down and focus on feeling your baby move.  You should feel at least 10 movements in 2 hours.  If you don't, you should call the office or go to [facility].   

## 2016-05-18 NOTE — Progress Notes (Signed)
Work-in Low-risk OB appointment [id number] 1858w3d Estimated Date of Delivery: 06/05/16 BP 124/82   Pulse 80   Wt 152 lb (68.9 kg)   LMP  (LMP Unknown)   BMI 28.72 kg/m   BP, weight, and urine reviewed.  Refer to obstetrical flow sheet for FH & FHR.  Reports good fm.  Denies lof, vb, or uti s/s. UCs q 5mins, started yesterday. White d/c w/o odor/itching/irritation.  GBS collected (was ordered 12/5, appears to not have been run) SVE 1.5/50/-2, vtx, no change from 12/5 Reviewed labor s/s, fkc. Plan:  Continue routine obstetrical care  F/U as scheduled for OB appointment

## 2016-05-20 LAB — STREP GP B NAA: Strep Gp B NAA: POSITIVE — AB

## 2016-05-22 ENCOUNTER — Encounter: Payer: Managed Care, Other (non HMO) | Admitting: Obstetrics & Gynecology

## 2016-05-25 ENCOUNTER — Encounter: Payer: Self-pay | Admitting: Women's Health

## 2016-05-25 ENCOUNTER — Ambulatory Visit (INDEPENDENT_AMBULATORY_CARE_PROVIDER_SITE_OTHER): Payer: Managed Care, Other (non HMO) | Admitting: Women's Health

## 2016-05-25 ENCOUNTER — Encounter (HOSPITAL_COMMUNITY): Payer: Self-pay | Admitting: *Deleted

## 2016-05-25 ENCOUNTER — Inpatient Hospital Stay (HOSPITAL_COMMUNITY)
Admission: AD | Admit: 2016-05-25 | Discharge: 2016-05-28 | DRG: 775 | Disposition: A | Discharge status: Home / Self Care | Payer: Managed Care, Other (non HMO) | Source: Ambulatory Visit | Attending: Obstetrics and Gynecology | Admitting: Obstetrics and Gynecology

## 2016-05-25 VITALS — BP 132/96 | HR 80 | Wt 154.0 lb

## 2016-05-25 DIAGNOSIS — Z3A38 38 weeks gestation of pregnancy: Secondary | ICD-10-CM

## 2016-05-25 DIAGNOSIS — Z331 Pregnant state, incidental: Secondary | ICD-10-CM

## 2016-05-25 DIAGNOSIS — O09299 Supervision of pregnancy with other poor reproductive or obstetric history, unspecified trimester: Secondary | ICD-10-CM

## 2016-05-25 DIAGNOSIS — O34211 Maternal care for low transverse scar from previous cesarean delivery: Secondary | ICD-10-CM | POA: Diagnosis present

## 2016-05-25 DIAGNOSIS — O09293 Supervision of pregnancy with other poor reproductive or obstetric history, third trimester: Secondary | ICD-10-CM | POA: Diagnosis not present

## 2016-05-25 DIAGNOSIS — N9089 Other specified noninflammatory disorders of vulva and perineum: Secondary | ICD-10-CM

## 2016-05-25 DIAGNOSIS — R109 Unspecified abdominal pain: Secondary | ICD-10-CM

## 2016-05-25 DIAGNOSIS — Z8249 Family history of ischemic heart disease and other diseases of the circulatory system: Secondary | ICD-10-CM

## 2016-05-25 DIAGNOSIS — Z3483 Encounter for supervision of other normal pregnancy, third trimester: Secondary | ICD-10-CM

## 2016-05-25 DIAGNOSIS — O99824 Streptococcus B carrier state complicating childbirth: Secondary | ICD-10-CM | POA: Diagnosis present

## 2016-05-25 DIAGNOSIS — O1414 Severe pre-eclampsia complicating childbirth: Principal | ICD-10-CM | POA: Diagnosis present

## 2016-05-25 DIAGNOSIS — R030 Elevated blood-pressure reading, without diagnosis of hypertension: Secondary | ICD-10-CM | POA: Diagnosis present

## 2016-05-25 DIAGNOSIS — Z862 Personal history of diseases of the blood and blood-forming organs and certain disorders involving the immune mechanism: Secondary | ICD-10-CM | POA: Diagnosis not present

## 2016-05-25 DIAGNOSIS — O34219 Maternal care for unspecified type scar from previous cesarean delivery: Secondary | ICD-10-CM

## 2016-05-25 DIAGNOSIS — O1410 Severe pre-eclampsia, unspecified trimester: Secondary | ICD-10-CM | POA: Diagnosis present

## 2016-05-25 DIAGNOSIS — Z1389 Encounter for screening for other disorder: Secondary | ICD-10-CM

## 2016-05-25 LAB — COMPREHENSIVE METABOLIC PANEL
ALK PHOS: 208 U/L — AB (ref 38–126)
ALT: 12 U/L — AB (ref 14–54)
AST: 19 U/L (ref 15–41)
Albumin: 2.8 g/dL — ABNORMAL LOW (ref 3.5–5.0)
Anion gap: 9 (ref 5–15)
BILIRUBIN TOTAL: 0.2 mg/dL — AB (ref 0.3–1.2)
BUN: 11 mg/dL (ref 6–20)
CALCIUM: 8.5 mg/dL — AB (ref 8.9–10.3)
CHLORIDE: 103 mmol/L (ref 101–111)
CO2: 18 mmol/L — ABNORMAL LOW (ref 22–32)
CREATININE: 0.5 mg/dL (ref 0.44–1.00)
Glucose, Bld: 80 mg/dL (ref 65–99)
Potassium: 3.7 mmol/L (ref 3.5–5.1)
Sodium: 130 mmol/L — ABNORMAL LOW (ref 135–145)
Total Protein: 6.4 g/dL — ABNORMAL LOW (ref 6.5–8.1)

## 2016-05-25 LAB — CBC
HCT: 39.5 % (ref 36.0–46.0)
HEMATOCRIT: 36.3 % (ref 36.0–46.0)
Hemoglobin: 13.8 g/dL (ref 12.0–15.0)
Hemoglobin: 12.9 g/dL (ref 12.0–15.0)
MCH: 29.5 pg (ref 26.0–34.0)
MCH: 29.8 pg (ref 26.0–34.0)
MCHC: 34.9 g/dL (ref 30.0–36.0)
MCHC: 35.5 g/dL (ref 30.0–36.0)
MCV: 84.4 fL (ref 78.0–100.0)
MCV: 83.8 fL (ref 78.0–100.0)
PLATELETS: 231 10*3/uL (ref 150–400)
Platelets: 212 10*3/uL (ref 150–400)
RBC: 4.68 MIL/uL (ref 3.87–5.11)
RBC: 4.33 MIL/uL (ref 3.87–5.11)
RDW: 13 % (ref 11.5–15.5)
RDW: 12.9 % (ref 11.5–15.5)
WBC: 8.6 10*3/uL (ref 4.0–10.5)
WBC: 8.3 10*3/uL (ref 4.0–10.5)

## 2016-05-25 LAB — TYPE AND SCREEN
ABO/RH(D): O POS
Antibody Screen: NEGATIVE

## 2016-05-25 LAB — PROTEIN / CREATININE RATIO, URINE
Creatinine, Urine: 31 mg/dL
PROTEIN CREATININE RATIO: 0.23 mg/mg{creat} — AB (ref 0.00–0.15)
TOTAL PROTEIN, URINE: 7 mg/dL

## 2016-05-25 MED ORDER — LABETALOL HCL 5 MG/ML IV SOLN
20.0000 mg | INTRAVENOUS | Status: DC | PRN
  Filled 2016-05-25: qty 4

## 2016-05-25 MED ORDER — ACETAMINOPHEN 325 MG PO TABS: 650.0000 mg | ORAL_TABLET | ORAL | Status: DC | PRN

## 2016-05-25 MED ORDER — OXYTOCIN 40 UNITS IN LACTATED RINGERS INFUSION - SIMPLE MED
1.0000 m[IU]/min | INTRAVENOUS | Status: DC
  Administered 2016-05-25: 2 m[IU]/min via INTRAVENOUS
  Filled 2016-05-25: qty 1000

## 2016-05-25 MED ORDER — OXYCODONE-ACETAMINOPHEN 5-325 MG PO TABS: 2.0000 | ORAL_TABLET | ORAL | Status: DC | PRN

## 2016-05-25 MED ORDER — DEXTROSE 5 % IV SOLN
5.0000 10*6.[IU] | Freq: Once | INTRAVENOUS | Status: AC
  Administered 2016-05-25: 5 10*6.[IU] via INTRAVENOUS
  Filled 2016-05-25: qty 5

## 2016-05-25 MED ORDER — LACTATED RINGERS IV SOLN
INTRAVENOUS | Status: DC
  Administered 2016-05-25: 14:00:00 via INTRAVENOUS

## 2016-05-25 MED ORDER — SODIUM CHLORIDE 0.9 % IV SOLN
1.0000 g | INTRAVENOUS | Status: DC
  Filled 2016-05-25 (×2): qty 1000

## 2016-05-25 MED ORDER — PENICILLIN G POT IN DEXTROSE 60000 UNIT/ML IV SOLN
3.0000 10*6.[IU] | INTRAVENOUS | Status: DC
  Administered 2016-05-25 – 2016-05-26 (×2): 3 10*6.[IU] via INTRAVENOUS
  Filled 2016-05-25 (×5): qty 50

## 2016-05-25 MED ORDER — TERBUTALINE SULFATE 1 MG/ML IJ SOLN
0.2500 mg | Freq: Once | INTRAMUSCULAR | Status: DC | PRN
  Filled 2016-05-25: qty 1

## 2016-05-25 MED ORDER — LACTATED RINGERS IV SOLN
INTRAVENOUS | Status: DC
  Administered 2016-05-26: 01:00:00 via INTRAVENOUS

## 2016-05-25 MED ORDER — LACTATED RINGERS IV SOLN: 500.0000 mL | INTRAVENOUS | Status: DC | PRN

## 2016-05-25 MED ORDER — LIDOCAINE HCL (PF) 1 % IJ SOLN
30.0000 mL | INTRAMUSCULAR | Status: DC | PRN
  Filled 2016-05-25: qty 30

## 2016-05-25 MED ORDER — FENTANYL CITRATE (PF) 100 MCG/2ML IJ SOLN
100.0000 ug | INTRAMUSCULAR | Status: DC | PRN
  Administered 2016-05-26: 100 ug via INTRAVENOUS
  Filled 2016-05-25: qty 2

## 2016-05-25 MED ORDER — LABETALOL HCL 5 MG/ML IV SOLN: 20.0000 mg | INTRAVENOUS | Status: DC | PRN

## 2016-05-25 MED ORDER — SOD CITRATE-CITRIC ACID 500-334 MG/5ML PO SOLN: 30.0000 mL | ORAL | Status: DC | PRN

## 2016-05-25 MED ORDER — SODIUM CHLORIDE 0.9 % IV SOLN
2.0000 g | INTRAVENOUS | Status: DC
  Filled 2016-05-25: qty 2000

## 2016-05-25 MED ORDER — HYDRALAZINE HCL 20 MG/ML IJ SOLN: 10.0000 mg | Freq: Once | INTRAMUSCULAR | Status: DC | PRN

## 2016-05-25 MED ORDER — OXYTOCIN 40 UNITS IN LACTATED RINGERS INFUSION - SIMPLE MED: 2.5000 [IU]/h | INTRAVENOUS | Status: DC

## 2016-05-25 MED ORDER — HYDRALAZINE HCL 20 MG/ML IJ SOLN
10.0000 mg | Freq: Once | INTRAMUSCULAR | Status: AC | PRN
  Administered 2016-05-25: 10 mg via INTRAVENOUS
  Filled 2016-05-25: qty 1

## 2016-05-25 MED ORDER — OXYTOCIN BOLUS FROM INFUSION: 500.0000 mL | Freq: Once | INTRAVENOUS | Status: DC

## 2016-05-25 MED ORDER — OXYCODONE-ACETAMINOPHEN 5-325 MG PO TABS: 1.0000 | ORAL_TABLET | ORAL | Status: DC | PRN

## 2016-05-25 MED ORDER — ONDANSETRON HCL 4 MG/2ML IJ SOLN: 4.0000 mg | Freq: Four times a day (QID) | INTRAMUSCULAR | Status: DC | PRN

## 2016-05-25 NOTE — Patient Instructions (Addendum)
Call the office [phone]) or go to [facility] for these signs of pre-eclampsia:  Severe headache that does not go away with Tylenol  Visual changes- seeing spots, double, blurred vision  Pain under your right breast or upper abdomen that does not go away with Tums or heartburn medicine  Nausea and/or vomiting  Severe swelling in your hands, feet, and face      Call the office [phone]) or go to [facility] if:  You begin to have strong, frequent contractions  Your water breaks.  Sometimes it is a big gush of fluid, sometimes it is just a trickle that keeps getting your panties wet or running down your legs  You have vaginal bleeding.  It is normal to have a small amount of spotting if your cervix was checked.   You don't feel your baby moving like normal.  If you don't, get you something to eat and drink and lay down and focus on feeling your baby move.  You should feel at least 10 movements in 2 hours.  If you don't, you should call the office or go to [facility].     [facility] Cont[facility] Contractions of the uterus can occur throughout pregnancy. Contractions are not always a sign that you are in labor.  WHAT ARE BRAXTON HICKS CONTRACTIONS?  Contractions that occur before labor are called Braxton Hicks contractions, or false labor. Toward the end of pregnancy (32-34 weeks), these contractions can develop more often and may become more forceful. This is not true labor because these contractions do not result in opening (dilatation) and thinning of the cervix. They are sometimes difficult to tell apart from true labor because these contractions can be forceful and people have different pain tolerances. You should not feel embarrassed if you go to the hospital with false labor. Sometimes, the only way to tell if you are in true labor is for your health care provider to look for changes in the cervix. If there are no prenatal problems or other health problems  associated with the pregnancy, it is completely safe to be sent home with false labor and await the onset of true labor. HOW CAN YOU TELL THE DIFFERENCE BETWEEN TRUE AND FALSE LABOR? False Labor   The contractions of false labor are usually shorter and not as hard as those of true labor.   The contractions are usually irregular.   The contractions are often felt in the front of the lower abdomen and in the groin.   The contractions may go away when you walk around or change positions while lying down.   The contractions get weaker and are shorter lasting as time goes on.   The contractions do not usually become progressively stronger, regular, and closer together as with true labor.  True Labor   Contractions in true labor last 30-70 seconds, become very regular, usually become more intense, and increase in frequency.   The contractions do not go away with walking.   The discomfort is usually felt in the top of the uterus and spreads to the lower abdomen and low back.   True labor can be determined by your health care provider with an exam. This will show that the cervix is dilating and getting thinner.  WHAT TO REMEMBER  Keep up with your usual exercises and follow other instructions given by your health care provider.   Take medicines as directed by your health care provider.   Keep your regular prenatal appointments.   Eat  and drink lightly if you think you are going into labor.   If Braxton Hicks contractions are making you uncomfortable:   Change your position from lying down or resting to walking, or from walking to resting.   Sit and rest in a tub of warm water.   Drink 2-3 glasses of water. Dehydration may cause these contractions.   Do slow and deep breathing several times an hour.  WHEN SHOULD I SEEK IMMEDIATE MEDICAL CARE? Seek immediate medical care if:  Your contractions become stronger, more regular, and closer together.   You have  fluid leaking or gushing from your vagina.   You have a fever.   You pass blood-tinged mucus.   You have vaginal bleeding.   You have continuous abdominal pain.   You have low back pain that you never had before.   You feel your baby's head pushing down and causing pelvic pressure.   Your baby is not moving as much as it used to.  This information is not intended to replace advice given to you by your health care provider. Make sure you discuss any questions you have with your health care provider. Document Released: 05/28/2005 Document Revised: 09/19/2015 Document Reviewed: 03/09/2013 Elsevier Interactive Patient Education  2017 [employer]

## 2016-05-25 NOTE — Progress Notes (Addendum)
Assumed care of pt. Resting comfortably w/o complaints. Up to bathroom. Voided without problems.   2112: interpreter notified. MD in unit for FB placement and SVE  2120: interpreter and provider at bs. POC discussed with pt. Provider aware of fluctuating Bps.   2125: FB placed with 60mL tap water. Instructed to call nurse when FB falls out.   2133: monitors adjusted  2336: Provider notified for clarification of orders. Pt may receive Hydralzine x1 first if BP 160/110. May administer Labetalol up to 3 doses if hydralazine does not work per protocol.    2353: FB out. Tech notified to set up room  0030: pt crying from contraction pain. Cont to refuse pain medication or epidural. SVE. 6-7/80/0. Vertex. No BB felt  0058: up to bathroom and voided.   0127: pt called out wanting to push. SVE: 7.5/80/0-1-  0200: pt cont to breathe thru ctx 0205: fentanyl 100mcg IVPush given for increasing pain.   [id number]: pt throwing up into basin. chux noted fluid and running down pt's legs. R/o SROM. SVE: BB noted 8/80/-1. Chux and blanket changed.   0400: pt c/o increasing rectal pressure. SVE 8.5/90/-1 with BB  0420: Provider at bs for SVE and AROM   0530: 10/100/+1 0531:  Provider notified for delivery at at bs 0534: delivery of viable female. APGAR 8/9. Loose nuchal easily reduced x1.   [id number]: Provider notified. Aware of large egg size clot that expelled and moderate bleeding. New orders received for administration of 800mcg cytotec rectal.   [id number]: Cytotec 800mcg rectal administered. F massaged to Firm, midline, U, small to moderate oozing lochia rubra bleeding noted. Will cont to monitor

## 2016-05-25 NOTE — Progress Notes (Signed)
Patient ID: Sheena Wagner, female   DOB: 05/06/1981, 35 y.o.   MRN: [id number]  Feeling ctx q 5 mins; no H/A or VD  BP 146/96, other VSS FHR 130s, +accels, no decels Ctx hard to pick up on toco; Pit @ 766mu/min Cx 2/thick/-2  IUP@term  Pre-e Cx unfavorable  Foley bulb inserted without difficulty Continue with Pitocin  Cam HaiSHAW, Dennison Mcdaid CNM 05/25/2016 9:31 PM

## 2016-05-25 NOTE — MAU Provider Note (Signed)
History   Patient Sheena Wagner is a [phone] here with elevated blood pressures in the office. She was seen by Sheena Wagner for her routine prenatal visit this morning and then sent to MAU for evaluation.   CSN: [id number]  Arrival date and time: 05/25/16 1141   None     No chief complaint on file.  Patient has a history of pre-eclampsia, IUFD, HELLP, DIC and PPH with the last two pregnancies (her first two were without problems). Today she had elevated pressures at [facility] and Sheena Wagner sent her in for evaluation.     OB History    Gravida Para Term Preterm AB Living   7 4 4   2 3    SAB TAB Ectopic Multiple Live Births   2       3      Past Medical History:  Diagnosis Date  . DIC (disseminated intravascular coagulation) (HCC)   . History of HELLP syndrome, currently pregnant   . History of stillbirth   . Hypertension    only w/pregnancy  . Missed periods 08/02/2015  . Nausea 08/02/2015  . Placental abruption    Had preX, HELLP  . Stomach pain 08/02/2015    Past Surgical History:  Procedure Laterality Date  . CESAREAN SECTION  2003    Family History  Problem Relation Age of Onset  . Hypertension Brother   . Hypertension Brother     Social History  Substance Use Topics  . Smoking status: Never Smoker  . Smokeless tobacco: Never Used  . Alcohol use No    Allergies: No Known Allergies  Prescriptions Prior to Admission  Medication Sig Dispense Refill Last Dose  . aspirin EC 81 MG tablet Take 1 tablet (81 mg total) by mouth daily. 30 tablet 6 05/24/2016 at Unknown time  . Prenatal Vit-Fe Fumarate-FA (PNV PRENATAL PLUS MULTIVITAMIN) 27-1 MG TABS Take 1 tablet by mouth daily. 30 tablet 11 05/24/2016 at Unknown time    Review of Systems  Constitutional: Negative.   HENT: Negative.   Eyes: Negative.   Respiratory: Negative.   Cardiovascular: Negative.   Gastrointestinal: Positive for abdominal pain.       Patient states that she feels contractions.    Genitourinary: Negative.   Musculoskeletal: Negative.   Skin: Negative.   Neurological: Negative.   Psychiatric/Behavioral: Negative.    Physical Exam   Blood pressure 171/92, pulse 80, temperature 98.2 F (36.8 C), resp. rate 18.  Vitals:   05/25/16 1344 05/25/16 1347 05/25/16 1402 05/25/16 1417  BP: 138/95 171/92 144/91 138/90  Pulse: 78 80 81 80  Resp:      Temp:         Physical Exam  Constitutional: She is oriented to person, place, and time. She appears well-developed and well-nourished.  HENT:  Head: Normocephalic.  Neck: Normal range of motion.  Respiratory: Effort normal. No respiratory distress. She has no wheezes.  GI: Soft. She exhibits no distension and no mass. There is no tenderness. There is no rebound and no guarding.  Genitourinary: Vagina normal. Rectal exam shows guaiac negative stool. No vaginal discharge found.  Genitourinary Comments: Patient is 2/50/-2. Intact.   Musculoskeletal: Normal range of motion.  Neurological: She is alert and oriented to person, place, and time.  Skin: Skin is warm and dry.  Psychiatric: She has a normal mood and affect.    MAU Course  Procedures  MDM -CBC -UPC-0.23 -FHR 130; accelerations present with moderate variability, no decelerations.   Assessment  and Plan  Patient's pressure are labile; given her history of IUFD and new elevated blood pressures, patient to be admitted to L and D and induced. Will begin mag on L and D if severe range pressures continue. Plan of care discussed with Dr. Vergie LivingPickens.   Sheena Wagner CNM 05/25/2016, 1:54 PM

## 2016-05-25 NOTE — Progress Notes (Signed)
Low-risk OB appointment [id number] 5752w3d Estimated Date of Delivery: 06/05/16 LMP  (LMP Unknown)   BP, weight, and urine reviewed.  Refer to obstetrical flow sheet for FH & FHR.  Reports good fm.  Denies regular uc's, lof, vb, or uti s/s.  Visual changes/blurred vision, denies headache, some RUQ pain at times, denies n/v. Had episode on Wed when belly got real hard, baby stopped moving, felt a lot of pressure, then got better. Happened again yesterday. Normal fm today. Does have h/o HELLP/DIC w/ IUFD d/t abruption w/ 3rd pregnancy, pre-e also last pregnancy.  BP elevated today, no proteinuria No edema, DTRs 2+, no clonus SVE per request: 2.5/50/-2, vtx W/ her hx recommend going to MAU for full pre-e work-up/monitoring of bp/sx If d/c'd from MAU, to return on Monday for f/u, reviewed pre-e s/s, warning s/s to report Spoke w/ Philipp DeputyKim Shaw, CNM, notified her pt on her way for eval

## 2016-05-25 NOTE — MAU Note (Signed)
Pt presents to MAU today for evaluation of increase in blood pressures. [facility] [name] CNM and sent for further evaluation.

## 2016-05-25 NOTE — H&P (Signed)
Sheena Wagner is a 35 y.o. female [phone] with IUP at 3159w3d presenting for high blood evaluation. Pt states she has been having irregular, every 3-6 minutes contractions, associated with none vaginal bleeding for none hours..  Membranes are intact, with active fetal movement.   PNCare at [facility]  since 10 weeks and 1 day.  Prenatal History/Complications:  Past Medical History: Past Medical History:  Diagnosis Date  . DIC (disseminated intravascular coagulation) (HCC)   . History of HELLP syndrome, currently pregnant   . History of stillbirth   . Hypertension    only w/pregnancy  . Missed periods 08/02/2015  . Nausea 08/02/2015  . Placental abruption    Had preX, HELLP  . Stomach pain 08/02/2015    Past Surgical History: Past Surgical History:  Procedure Laterality Date  . CESAREAN SECTION  2003    Obstetrical History: OB History    Gravida Para Term Preterm AB Living   7 4 4   2 3    SAB TAB Ectopic Multiple Live Births   2       3       Social History: Social History   Social History  . Marital status: Single    Spouse name: N/A  . Number of children: N/A  . Years of education: N/A   Social History Main Topics  . Smoking status: Never Smoker  . Smokeless tobacco: Never Used  . Alcohol use No  . Drug use: No  . Sexual activity: Yes    Birth control/ protection: None   Other Topics Concern  . None   Social History Narrative  . None    Family History: Family History  Problem Relation Age of Onset  . Hypertension Brother   . Hypertension Brother     Allergies: No Known Allergies  Prescriptions Prior to Admission  Medication Sig Dispense Refill Last Dose  . aspirin EC 81 MG tablet Take 1 tablet (81 mg total) by mouth daily. 30 tablet 6 05/24/2016 at Unknown time  . Prenatal Vit-Fe Fumarate-FA (PNV PRENATAL PLUS MULTIVITAMIN) 27-1 MG TABS Take 1 tablet by mouth daily. 30 tablet 11 05/24/2016 at Unknown time     Prenatal Transfer Tool  Maternal  Diabetes: No Genetic Screening: Normal Maternal Ultrasounds/Referrals: Normal Fetal Ultrasounds or other Referrals:  None Maternal Substance Abuse:  No Significant Maternal Medications:  Meds include: Other:  Significant Maternal Lab Results: None     Review of Systems   Constitutional: Negative for fever and chills Eyes: Negative for visual disturbances Respiratory: Negative for shortness of breath, dyspnea Cardiovascular: Negative for chest pain or palpitations  Gastrointestinal: Negative for vomiting, diarrhea and constipation.  POSITIVE for abdominal pain (contractions) Genitourinary: Negative for dysuria and urgency Musculoskeletal: Negative for back pain, joint pain, myalgias  Neurological: Negative for dizziness and headaches      Blood pressure 135/93, pulse 77, temperature 98.2 F (36.8 C), resp. rate 18. General appearance: alert, cooperative and appears stated age Lungs: clear to auscultation bilaterally Heart: regular rate and rhythm Abdomen: soft, non-tender; bowel sounds normal Pelvic: see below Extremities: Homans sign is negative, no sign of DVT DTR's +1 Presentation: cephalic Fetal monitoring  Baseline: 130 bpm, moderate variability, present acceleartions, no decelerations Uterine activity  Date/time of onset: Wednesday but were not very strong Dilation: 2 Effacement (%): Thick Station: -2 Exam by:: Rennie NatterK. Koostra, CNM   Prenatal labs: ABO, Rh: O/Positive/-- (05/31 1713) Antibody: Negative (10/20 0855) Rubella: !Error! IMMune RPR: Non Reactive (10/20 0855)  HBsAg: Negative (05/31  1713)  HIV: Non Reactive (10/20 0855)  GBS: Positive (12/08 1330)  2 hr Glucola 84 Genetic screening  negative Anatomy US normal   Results for orders placed or performed during the hospital encounter of 05/25/16 (from the past 24 hour(s))  Protein / creatinine ratio, urine   Collection Time: 05/25/16 12:00 PM  Result Value Ref Range   Creatinine, Urine 31.00 mg/dL    Total Protein, Urine 7 mg/dL   Protein Creatinine Ratio 0.23 (H) 0.00 - 0.15 mg/mg[Cre]  Comprehensive metabolic panel   Collection Time: 05/25/16  1:29 PM  Result Value Ref Range   Sodium 130 (L) 135 - 145 mmol/L   Potassium 3.7 3.5 - 5.1 mmol/L   Chloride 103 101 - 111 mmol/L   CO2 18 (L) 22 - 32 mmol/L   Glucose, Bld 80 65 - 99 mg/dL   BUN 11 6 - 20 mg/dL   Creatinine, Ser 1.610.50 0.44 - 1.00 mg/dL   Calcium 8.5 (L) 8.9 - 10.3 mg/dL   Total Protein 6.4 (L) 6.5 - 8.1 g/dL   Albumin 2.8 (L) 3.5 - 5.0 g/dL   AST 19 15 - 41 U/L   ALT 12 (L) 14 - 54 U/L   Alkaline Phosphatase 208 (H) 38 - 126 U/L   Total Bilirubin 0.2 (L) 0.3 - 1.2 mg/dL   GFR calc non Af Amer >60 >60 mL/min   GFR calc Af Amer >60 >60 mL/min   Anion gap 9 5 - 15  CBC   Collection Time: 05/25/16  1:29 PM  Result Value Ref Range   WBC 8.6 4.0 - 10.5 K/uL   RBC 4.68 3.87 - 5.11 MIL/uL   Hemoglobin 13.8 12.0 - 15.0 g/dL   HCT 09.639.5 04.536.0 - 40.946.0 %   MCV 84.4 78.0 - 100.0 fL   MCH 29.5 26.0 - 34.0 pg   MCHC 34.9 30.0 - 36.0 g/dL   RDW 81.113.0 91.411.5 - 78.215.5 %   Platelets 231 150 - 400 K/uL    Assessment: Sheena Wagner is a 35 y.o. [id number] with an IUP at 7644w3d presenting for high blood pressure evaluation. Given her labile pressures and history of IUFD and pre-eclampsia, patient to be admitted and induced. Plan of care discussed with Dr. Vergie LivingPickens. Will begin mag if patient continues to have severe range pressures or develop neuro symptoms.   Plan: #Labor: expectant management #Pain:  Per request #FWB Cat 1 #ID: GBS: Positive #MOF:  Breast #MOC: Nexplanon #Circ: Not planning   Marylene LandKathryn Lorraine Kooistra CNM 05/25/2016, 3:29 PM

## 2016-05-25 NOTE — Anesthesia Pain Management Evaluation Note (Signed)
  CRNA Pain Management Visit Note  Patient: Sheena Wagner, 35 y.o., female  "Hello I am a member of the anesthesia team at [facility]. We have an anesthesia team available at all times to provide care throughout the hospital, including epidural management and anesthesia for C-section. I don't know your plan for the delivery whether it a natural birth, water birth, IV sedation, nitrous supplementation, doula or epidural, but we want to meet your pain goals."   1.Was your pain managed to your expectations on prior hospitalizations?   Yes   2.What is your expectation for pain management during this hospitalization?     Labor support without medications  3.How can we help you reach that goal? Nothing  Record the patient's initial score and the patient's pain goal.   Pain: 5  Pain Goal: 10 [facility] wants you to be able to say your pain was always managed very well.  Sheena Wagner 05/25/2016

## 2016-05-25 NOTE — Progress Notes (Signed)
Spanish interpreter at bedside for admission  

## 2016-05-26 ENCOUNTER — Inpatient Hospital Stay (HOSPITAL_COMMUNITY): Payer: Managed Care, Other (non HMO) | Admitting: Anesthesiology

## 2016-05-26 ENCOUNTER — Encounter (HOSPITAL_COMMUNITY): Payer: Self-pay

## 2016-05-26 LAB — RPR: RPR Ser Ql: NONREACTIVE

## 2016-05-26 MED ORDER — METHYLERGONOVINE MALEATE 0.2 MG PO TABS: 0.2000 mg | ORAL_TABLET | ORAL | Status: DC | PRN

## 2016-05-26 MED ORDER — ONDANSETRON HCL 4 MG/2ML IJ SOLN: 4.0000 mg | INTRAMUSCULAR | Status: DC | PRN

## 2016-05-26 MED ORDER — OXYCODONE-ACETAMINOPHEN 5-325 MG PO TABS
2.0000 | ORAL_TABLET | ORAL | Status: DC | PRN
  Administered 2016-05-26: 2 via ORAL
  Filled 2016-05-26: qty 2

## 2016-05-26 MED ORDER — AMLODIPINE BESYLATE 10 MG PO TABS
10.0000 mg | ORAL_TABLET | Freq: Every day | ORAL | Status: DC
  Administered 2016-05-26 – 2016-05-27 (×2): 10 mg via ORAL
  Filled 2016-05-26 (×3): qty 1

## 2016-05-26 MED ORDER — MISOPROSTOL 200 MCG PO TABS
800.0000 ug | ORAL_TABLET | Freq: Once | ORAL | Status: AC
  Administered 2016-05-26: 800 ug via RECTAL

## 2016-05-26 MED ORDER — PHENYLEPHRINE 40 MCG/ML (10ML) SYRINGE FOR IV PUSH (FOR BLOOD PRESSURE SUPPORT)
PREFILLED_SYRINGE | INTRAVENOUS | Status: AC
  Filled 2016-05-26: qty 10

## 2016-05-26 MED ORDER — SIMETHICONE 80 MG PO CHEW: 80.0000 mg | CHEWABLE_TABLET | ORAL | Status: DC | PRN

## 2016-05-26 MED ORDER — HYDRALAZINE HCL 20 MG/ML IJ SOLN: 5.0000 mg | INTRAMUSCULAR | Status: DC | PRN

## 2016-05-26 MED ORDER — FENTANYL 2.5 MCG/ML BUPIVACAINE 1/10 % EPIDURAL INFUSION (WH - ANES)
INTRAMUSCULAR | Status: AC
  Filled 2016-05-26: qty 100

## 2016-05-26 MED ORDER — METHYLERGONOVINE MALEATE 0.2 MG/ML IJ SOLN
INTRAMUSCULAR | Status: AC
  Filled 2016-05-26: qty 1

## 2016-05-26 MED ORDER — DIBUCAINE 1 % RE OINT: 1.0000 "application " | TOPICAL_OINTMENT | RECTAL | Status: DC | PRN

## 2016-05-26 MED ORDER — MISOPROSTOL 200 MCG PO TABS
ORAL_TABLET | ORAL | Status: AC
  Filled 2016-05-26: qty 4

## 2016-05-26 MED ORDER — COCONUT OIL OIL: 1.0000 "application " | TOPICAL_OIL | Status: DC | PRN

## 2016-05-26 MED ORDER — BENZOCAINE-MENTHOL 20-0.5 % EX AERO: 1.0000 "application " | INHALATION_SPRAY | CUTANEOUS | Status: DC | PRN

## 2016-05-26 MED ORDER — ZOLPIDEM TARTRATE 5 MG PO TABS: 5.0000 mg | ORAL_TABLET | Freq: Every evening | ORAL | Status: DC | PRN

## 2016-05-26 MED ORDER — DIPHENHYDRAMINE HCL 25 MG PO CAPS: 25.0000 mg | ORAL_CAPSULE | Freq: Four times a day (QID) | ORAL | Status: DC | PRN

## 2016-05-26 MED ORDER — WITCH HAZEL-GLYCERIN EX PADS: 1.0000 "application " | MEDICATED_PAD | CUTANEOUS | Status: DC | PRN

## 2016-05-26 MED ORDER — ONDANSETRON HCL 4 MG PO TABS
4.0000 mg | ORAL_TABLET | ORAL | Status: DC | PRN
Start: 2016-05-26 — End: 2016-05-28

## 2016-05-26 MED ORDER — ACETAMINOPHEN 325 MG PO TABS
650.0000 mg | ORAL_TABLET | ORAL | Status: DC | PRN
Start: 2016-05-26 — End: 2016-05-28

## 2016-05-26 MED ORDER — OXYCODONE HCL 5 MG PO TABS: 5.0000 mg | ORAL_TABLET | ORAL | Status: DC | PRN

## 2016-05-26 MED ORDER — OXYCODONE-ACETAMINOPHEN 5-325 MG PO TABS: 1.0000 | ORAL_TABLET | ORAL | Status: DC | PRN

## 2016-05-26 MED ORDER — TETANUS-DIPHTH-ACELL PERTUSSIS 5-2.5-18.5 LF-MCG/0.5 IM SUSP
0.5000 mL | Freq: Once | INTRAMUSCULAR | Status: AC
  Administered 2016-05-28: 0.5 mL via INTRAMUSCULAR

## 2016-05-26 MED ORDER — METHYLERGONOVINE MALEATE 0.2 MG/ML IJ SOLN
0.2000 mg | INTRAMUSCULAR | Status: DC | PRN
  Administered 2016-05-26: 0.2 mg via INTRAMUSCULAR

## 2016-05-26 MED ORDER — IBUPROFEN 600 MG PO TABS
600.0000 mg | ORAL_TABLET | Freq: Four times a day (QID) | ORAL | Status: DC
  Administered 2016-05-26 – 2016-05-28 (×7): 600 mg via ORAL
  Filled 2016-05-26 (×8): qty 1

## 2016-05-26 MED ORDER — PRENATAL MULTIVITAMIN CH
1.0000 | ORAL_TABLET | Freq: Every day | ORAL | Status: DC
  Administered 2016-05-26 – 2016-05-27 (×2): 1 via ORAL
  Filled 2016-05-26 (×2): qty 1

## 2016-05-26 MED ORDER — LABETALOL HCL 5 MG/ML IV SOLN: 20.0000 mg | INTRAVENOUS | Status: DC | PRN

## 2016-05-26 MED ORDER — SENNOSIDES-DOCUSATE SODIUM 8.6-50 MG PO TABS
2.0000 | ORAL_TABLET | ORAL | Status: DC
  Administered 2016-05-26 – 2016-05-27 (×2): 2 via ORAL
  Filled 2016-05-26 (×2): qty 2

## 2016-05-26 NOTE — Progress Notes (Signed)
[facility] shift [facility] RN and she stated that patient had 800 ml of blood loss and received 800 mg of cytotec rectally.

## 2016-05-26 NOTE — Progress Notes (Signed)
Interpreter, LC, and RN at pts bedside. Pts condition, toileting, breastfeeding, and diet discussed.

## 2016-05-26 NOTE — Progress Notes (Signed)
NURSE CONTACTED PHARMACY FOR MEDICATION INFORMATION SHEET FOR [place]ASC [place]  INITIAL DOSE GIVEN.  FAMILY AT BEDSIDE.

## 2016-05-26 NOTE — Progress Notes (Signed)
Patient ID: Ledora BottcherMarta Esteban-Chavez, female   DOB: 06/12/1980, 35 y.o.   MRN: [id number]  Feeling rectal pressure; foley came out at midnight BPs 150/89, 148/90 FHR 140s, +accels, no decels, occ mi variables Ctx q 2-4 mins w/ Pit @ 476mu/min Cx 8/90/+1 after AROM for clear fluid  IUP@term  Transition gHTN  Anticipate SVD  Cam HaiSHAW, Konstantin Lehnen CNM 05/26/2016 4:40 AM

## 2016-05-26 NOTE — Anesthesia Preprocedure Evaluation (Signed)
Anesthesia Evaluation  Patient identified by MRN, date of birth, ID band Patient awake    Reviewed: Allergy & Precautions, H&P , NPO status , Patient's Chart, lab work & pertinent test results, reviewed documented beta blocker date and time   Airway Mallampati: I  TM Distance: >3 FB Neck ROM: full    Dental no notable dental hx.    Pulmonary neg pulmonary ROS,    Pulmonary exam normal        Cardiovascular hypertension, Pt. on home beta blockers Normal cardiovascular exam     Neuro/Psych negative neurological ROS  negative psych ROS   GI/Hepatic negative GI ROS, Neg liver ROS,   Endo/Other  negative endocrine ROS  Renal/GU negative Renal ROS     Musculoskeletal   Abdominal Normal abdominal exam  (+)   Peds  Hematology negative hematology ROS (+)   Anesthesia Other Findings   Reproductive/Obstetrics (+) Pregnancy                             Anesthesia Physical Anesthesia Plan  ASA: II  Anesthesia Plan: Epidural   Post-op Pain Management:    Induction:   Airway Management Planned:   Additional Equipment:   Intra-op Plan:   Post-operative Plan:   Informed Consent: I have reviewed the patients History and Physical, chart, labs and discussed the procedure including the risks, benefits and alternatives for the proposed anesthesia with the patient or authorized representative who has indicated his/her understanding and acceptance.     Plan Discussed with:   Anesthesia Plan Comments:         Anesthesia Quick Evaluation

## 2016-05-26 NOTE — Anesthesia Postprocedure Evaluation (Signed)
Anesthesia Post Note  Patient: Sheena BottcherMarta Wagner  Procedure(s) Performed: * No procedures listed *  Patient location during evaluation: L&D Anesthetic complications: no Comments: Pt delivered.     Last Vitals:  Vitals:   05/26/16 0414 05/26/16 0415  BP:  (!) 150/89  Pulse:  97  Resp:    Temp: 37.2 C     Last Pain:  Vitals:   05/26/16 0414  TempSrc: Oral  PainSc:    Pain Goal: Patients Stated Pain Goal: 10 (05/25/16 1940)               Terryn Rosenkranz JR,JOHN Susann GivensFRANKLIN

## 2016-05-26 NOTE — Progress Notes (Signed)
Post partum patient had continued trickling of blood. BP controlled for Methergine given. Significant increase in BP post methergine. Will order hydralazine protocol and D/C methergine.  Monitor BP and bleeding.

## 2016-05-26 NOTE — Progress Notes (Signed)
PT SHOWN HOW TO FINGER FEED WITH CURVED TIP SYRINGE PER REQUEST FOR FORMULA.  NURSE EXPLAINED SIZE OF INFANT STOMACH AND AMT OF COLOSTRUM PT HAS FOR INFANT.  PT STILL CONCERNED ABOUT AMT REQUIRED BY INFANT.  NURSE INSTRUCTED PT TO GIVE 5-7ML FORMULA AFTER BREAST FEEDING.  PT ENCOURAGED TO BREAST FEED FIRST THEN FINGER FEED.

## 2016-05-26 NOTE — Lactation Note (Signed)
This note was copied from a baby's chart. Lactation Consultation Note  Patient Name: Sheena Ledora BottcherMarta Esteban-Chavez OZDGU'YToday's Date: 05/26/2016 Reason for consult: Initial assessment Baby at 11 hr of life. Mom reports having low milk supply with her other children. The oldest she had a c/s and was only able to express "a little" of milk, the 2nd baby cried a lot, the 3rd baby passed away (see mom's Hx), and the last baby "lost some" wt. She started formula after birth with all of her children. Mom has wide space breast with slight puffy areolas (she said they always look like that). She was able to manually express colostrum bilaterally. Baby was jittery at this visit after being of the breast for 20 minutes (the feeding was reported by mom). RN given report. Discussed baby behavior, feeding frequency, baby belly size, voids, wt loss, breast changes, and nipple care. Given lactation handouts. Aware of OP services and support group. Mom will offer the breast on demand 8+/24hr. She will manually express and spoon feed if she feels like baby is not getting enough. If she is unable to manually express, she will call for lactation.    Maternal Data Has patient been taught Hand Expression?: Yes Does the patient have breastfeeding experience prior to this delivery?: Yes  Feeding Feeding Type: Breast Fed Length of feed: 0 min  LATCH Score/Interventions Latch: Too sleepy or reluctant, no latch achieved, no sucking elicited.                    Lactation Tools Discussed/Used     Consult Status Consult Status: Follow-up Date: 05/27/16 Follow-up type: In-patient    Rulon Eisenmengerlizabeth E Regis Hinton 05/26/2016, 5:08 PM

## 2016-05-27 DIAGNOSIS — O99824 Streptococcus B carrier state complicating childbirth: Secondary | ICD-10-CM

## 2016-05-27 DIAGNOSIS — Z862 Personal history of diseases of the blood and blood-forming organs and certain disorders involving the immune mechanism: Secondary | ICD-10-CM

## 2016-05-27 DIAGNOSIS — O1414 Severe pre-eclampsia complicating childbirth: Secondary | ICD-10-CM

## 2016-05-27 DIAGNOSIS — Z3A38 38 weeks gestation of pregnancy: Secondary | ICD-10-CM

## 2016-05-27 DIAGNOSIS — O34211 Maternal care for low transverse scar from previous cesarean delivery: Secondary | ICD-10-CM

## 2016-05-27 LAB — CBC WITH DIFFERENTIAL/PLATELET
BASOS ABS: 0 10*3/uL (ref 0.0–0.1)
BASOS PCT: 0 %
EOS ABS: 0.1 10*3/uL (ref 0.0–0.7)
Eosinophils Relative: 1 %
HEMATOCRIT: 33.1 % — AB (ref 36.0–46.0)
HEMOGLOBIN: 11.5 g/dL — AB (ref 12.0–15.0)
Lymphocytes Relative: 38 %
Lymphs Abs: 3.6 10*3/uL (ref 0.7–4.0)
MCH: 29.5 pg (ref 26.0–34.0)
MCHC: 34.7 g/dL (ref 30.0–36.0)
MCV: 84.9 fL (ref 78.0–100.0)
MONOS PCT: 4 %
Monocytes Absolute: 0.4 10*3/uL (ref 0.1–1.0)
NEUTROS ABS: 5.3 10*3/uL (ref 1.7–7.7)
NEUTROS PCT: 57 %
Platelets: 210 10*3/uL (ref 150–400)
RBC: 3.9 MIL/uL (ref 3.87–5.11)
RDW: 13.2 % (ref 11.5–15.5)
WBC: 9.4 10*3/uL (ref 4.0–10.5)

## 2016-05-27 NOTE — Progress Notes (Signed)
Post Partum Day 1 Subjective: no complaints, up ad lib, voiding and tolerating PO  Objective: Blood pressure 127/80, pulse 81, temperature 97.8 F (36.6 C), temperature source Oral, resp. rate 20, height 5\' 1"  (1.549 m), weight 154 lb (69.9 kg), SpO2 98 %, unknown if currently breastfeeding.  Physical Exam:  General: alert, cooperative, appears stated age and no distress Lochia: appropriate Uterine Fundus: firm Incision: n/a DVT Evaluation: No evidence of DVT seen on physical exam.   Recent Labs  05/25/16 1925 05/27/16 0518  HGB 12.9 11.5*  HCT 36.3 33.1*    Assessment/Plan: Plan for discharge tomorrow  Pre eclampsia   LOS: 2 days   Sheena Wagner 05/27/2016, 8:30 AM

## 2016-05-28 ENCOUNTER — Encounter: Payer: Managed Care, Other (non HMO) | Admitting: Women's Health

## 2016-05-28 MED ORDER — AMLODIPINE BESYLATE 10 MG PO TABS: 10.0000 mg | ORAL_TABLET | Freq: Every day | ORAL | 1 refills | Status: DC

## 2016-05-28 MED ORDER — ACETAMINOPHEN 325 MG PO TABS: 650.0000 mg | ORAL_TABLET | ORAL | Status: DC | PRN

## 2016-05-28 NOTE — Discharge Summary (Signed)
OB Discharge Summary     Patient Name: Sheena Wagner DOB: 05/02/1981 MRN: [id number]  Date of admission: 05/25/2016 Delivering MD: Cam HaiSHAW, KIMBERLY D   Date of discharge: 05/28/2016  Admitting diagnosis: 39WKS,BP Intrauterine pregnancy: [id number]     Secondary diagnosis:  Active Problems:   Severe preeclampsia  Additional problems: none     Discharge diagnosis: Term Pregnancy Delivered and Preeclampsia (mild)                                                                                                Post partum procedures:none  Augmentation: AROM, Pitocin, Cytotec and Foley Balloon  Complications: None  Hospital course:  Induction of Labor With Vaginal Delivery     35 y.o. yo [id number] at 8745w4d was admitted for induction of labor for mild pre-e on 05/25/2016. She was started on cx ripening using cytotec and foley, and then progressed to Pitocin/AROM. She did not have severe range BPs requiring mag sulfate in labor. Patient had an uncomplicated labor course as follows:  Membrane Rupture Time/Date: 4:21 AM ,05/26/2016   Intrapartum Procedures: Episiotomy: None [1]                                         Lacerations:  None [1]  Patient had a delivery of a Viable infant. 05/26/2016  Information for the patient's newborn:  Sheena Wagner, Boy [name] [[id number]][[id number]]  Delivery Method: VBAC    Pateint had an uncomplicated postpartum course.  She is ambulating, tolerating a regular diet, passing flatus, and urinating well. She was started on Norvasc on PPD#0 for BP control. Patient is discharged home in stable condition on 05/28/16.    Physical exam Vitals:   05/27/16 0000 05/27/16 0408 05/27/16 0940 05/28/16 0632  BP: 122/88 127/80 121/83 117/72  Pulse: 86 81 88 71  Resp: 20 20  18   Temp: 98.9 F (37.2 C) 97.8 F (36.6 C)  97.4 F (36.3 C)  TempSrc: Oral Oral  Oral  SpO2: 100% 98%  100%  Weight:      Height:       General: alert, cooperative and no distress Lochia:  appropriate Uterine Fundus: firm DVT Evaluation: No evidence of DVT seen on physical exam. Labs: Lab Results  Component Value Date   WBC 9.4 05/27/2016   HGB 11.5 (L) 05/27/2016   HCT 33.1 (L) 05/27/2016   MCV 84.9 05/27/2016   PLT 210 05/27/2016   CMP Latest Ref Rng & Units 05/25/2016  Glucose 65 - 99 mg/dL 80  BUN 6 - 20 mg/dL 11  Creatinine 4.400.44 - 1.021.00 mg/dL 7.250.50  Sodium 366135 - 440145 mmol/L 130(L)  Potassium 3.5 - 5.1 mmol/L 3.7  Chloride 101 - 111 mmol/L 103  CO2 22 - 32 mmol/L 18(L)  Calcium 8.9 - 10.3 mg/dL 3.4(V8.5(L)  Total Protein 6.5 - 8.1 g/dL 6.4(L)  Total Bilirubin 0.3 - 1.2 mg/dL 4.2(V0.2(L)  Alkaline Phos 38 - 126 U/L 208(H)  AST 15 - 41 U/L 19  ALT 14 - 54  U/L 12(L)    Discharge instruction: per After Visit Summary and "Baby and Me Booklet".  After visit meds:  Allergies as of 05/28/2016   No Known Allergies     Medication List    STOP taking these medications   aspirin EC 81 MG tablet     TAKE these medications   acetaminophen 325 MG tablet Commonly known as:  TYLENOL Take 2 tablets (650 mg total) by mouth every 4 (four) hours as needed (for pain scale < 4).   amLODipine 10 MG tablet Commonly known as:  NORVASC Take 1 tablet (10 mg total) by mouth daily.   PNV PRENATAL PLUS MULTIVITAMIN 27-1 MG Tabs Take 1 tablet by mouth daily.       Diet: routine diet  Activity: Advance as tolerated. Pelvic rest for 6 weeks.   Outpatient follow up:3 days for BP check Follow up Appt:Future Appointments Date Time Provider Department Center  05/28/2016 3:15 PM Cheral MarkerKimberly R Booker, CNM [facility]   Follow up Visit:No Follow-up on file.  Postpartum contraception: Nexplanon  Newborn Data: Live born female  Birth Weight: 6 lb 9.8 oz (2999 g) APGAR: 8, 9  Baby Feeding: Breast Disposition:home with mother   05/28/2016 Clearance CootsAndrew Tyson, MD   CNM attestation I have seen and examined this patient and agree with above documentation in the resident's note.    Sheena Wagner is a 35 y.o. [id number] s/p SVD.   Pain is well controlled.  Plan for birth control is Nexplanon.  Method of Feeding: breast  PE:  BP 117/72 (BP Location: Right Arm)   Pulse 71   Temp 97.4 F (36.3 C) (Oral)   Resp 18   Ht 5\' 1"  (1.549 m)   Wt 69.9 kg (154 lb)   LMP  (LMP Unknown)   SpO2 100%   Breastfeeding? Unknown   BMI 29.10 kg/m  Fundus firm   Recent Labs  05/25/16 1925 05/27/16 0518  HGB 12.9 11.5*  HCT 36.3 33.1*     Plan: discharge today - postpartum care discussed - f/u clinic in 72hrs for BP check, then 4-6 weeks for postpartum visit   Cam HaiSHAW, KIMBERLY, CNM 9:07 AM  05/28/2016

## 2016-05-28 NOTE — Progress Notes (Signed)
DC instructions given via language line. All questions answered.

## 2016-05-28 NOTE — Lactation Note (Signed)
This note was copied from a baby's chart. Lactation Consultation Note  Patient Name: Sheena Ledora BottcherMarta Esteban-Chavez ZOXWR'UToday's Date: 05/28/2016 Reason for consult: Follow-up assessment   With this mom and term baby, now 6352 hours old. Mom reports low milk supply. On exam, with had expression, glistening of colostrum only. Mom supplementing with formula, and denies questions/conerns at this time.    Maternal Data    Feeding    LATCH Score/Interventions                      Lactation Tools Discussed/Used     Consult Status Consult Status: Complete Follow-up type: Call as needed    Alfred LevinsLee, Shelie Lansing Anne 05/28/2016, 10:05 AM

## 2016-05-28 NOTE — Discharge Instructions (Signed)
Almacenamiento de [employer] (Storing Breast Milk) La leche materna es un lquido vital que contiene clulas que combaten infecciones (anticuerpos). La [employer] se sac con anterioridad (extrajo) debe almacenarse de un cierto modo para que mantenga su eficacia en lo que concierne a la proteccin del beb contra las infecciones. Las siguientes pautas son para Airline pilot materna para un beb sano nacido a trmino.  CUNTO TIEMPO SE PUEDE ALMACENAR LA LECHE MATERNA?  La leche puede almacenarse hasta 4horas a temperatura ambiente o a una temperatura de 59F a 22F (15,6C a 19,4C). Sin embargo, se la puede guardar durante 6 a 8horas si las piezas del sacaleches y los recipientes estn bien higienizados.  La leche puede almacenarse durante 3das en un refrigerador a menos de 13F (3,9C). Sin embargo, se la puede guardar durante 8das si las piezas del sacaleches y los recipientes estn bien higienizados.  La leche puede almacenarse durante 2semanas en un congelador que se encuentra dentro de Community education officer.  La leche puede almacenarse durante 3 o en un congelador con una puerta aparte.  La leche puede almacenarse durante 6 a en una cmara de congelacin a -50F (-20C). Posey Boyer de congelacin es un arcn congelador o un congelador independiente que no se abre con frecuencia y se mantiene a una temperatura inferior. CMO DEBO ALMACENAR LA LECHE MATERNA?  La leche materna puede almacenarse de la siguiente forma:  En un recipiente de vidrio.  En un envase de plstico rgido.  En Lucile Shutters de plstico diseada especialmente para Counselling psychologist. Muchas mujeres prefieren esta opcin porque ocupa menos espacio y se puede Press photographer al sacaleches.  Almacene la leche en porciones de 2 a 4onzas (60 a ) para que sea ms fcil descongelarla. Adems, esto es til para no desechar la [employer] beb no toma.  Deje un espacio de  aproximadamente una pulgada en la parte superior de la bolsa o el bibern para permitir que la [place] se expanda a medida que se congela.  [place]iquete cada recipiente con la fecha y la hora que se sac la leche para usarla en el orden en que se extrajo.  Si va a congelar la [place], gurdela en la parte posterior del congelador para evitar que los cambios de temperatura que se producen al abrir la puerta de la unidad la afecten. CMO DESCONGELAR LA LECHE MATERNA CONGELADA  Se puede descongelar la leche materna de la siguiente forma:  En un refrigerador.  Debajo del chorro de agua tibia del grifo.  En un recipiente con agua tibia que haya sido calentada en la estufa.  No caliente la [employer] en la estufa o en el microondas ya que se destruirn algunas de sus propiedades para combatir infecciones.  Se puede almacenar la [employer] Pal[employer] descongelada en un refrigerador durante 24horas como mximo, pero no se debe volver a Camera operator.  Si desea agregar [employer] Resources recientemente extrada a la [place], debe agregar menos cantidad de [place] de la que ya est congelada. Enfre la [employer] recientemente extrada en el refrigerador durante antes de agregrsela a la leche que est en Personal assistant. Esta informacin no tiene Theme park manager el consejo del mdico. Asegrese de hacerle al mdico cualquier pregunta que tenga. Document Released: 05/16/2009 Document Revised: 06/02/2013 Elsevier Interactive Patient Education  2017 [employer]. Parto vaginal, cuidados de puerperio (Postpartum Care After Vaginal Delivery) El perodo de tiempo que sigue inmediatamente al parto se conoce como puerperio. QU TIPO  DE ATENCIN MDICA RECIBIR?  Podra continuar recibiendo medicamentos y lquidos travs de una va intravenosa (IV) que se Scientific laboratory techniciancolocar en una de sus venas.  Si se le realiz una incisin cerca de la vagina (episiotoma) o si ha tenido Airline pilotalgn desgarro durante el parto, podran indicarle  que se coloque compresas fras sobre la episiotoma o Art therapistel desgarro. Esto ayuda a Engineer, materialsaliviar el dolor y la hinchazn.  Es posible que le den una botella rociadora para que use cuando vaya al bao. Puede utilizarla hasta que se sienta cmoda limpindose de la manera habitual. Siga los pasos a continuacin para usar la botella rociadora:  Antes de orinar, llene la botella rociadora con agua tibia. No use agua caliente.  Despus de Geographical information systems officerorinar, [place] Jer[place]ymientras an est sentada en el inodoro, use la botella rociadora para enjuagar el rea alrededor de la uretra y la abertura vaginal. Con esto podr limpiar cualquier rastro de orina y Lafayettesangre.  Puede hacer esto en lugar de secarse. Cuando comience a Barrister's clerksanar, podr usar la botella rociadora antes de secarse. Asegrese de secarse suavemente.  Llene la botella rociadora con agua limpia cada vez que vaya al bao.  Deber usar apsitos sanitarios. CMO PUEDO SENTIRME?  Quizs no tenga necesidad de orinar durante varias horas despus del parto.  Sentir algo de dolor y Associate Professormolestias en el abdomen y la vagina.  Si est amamantando, podra tener contracciones uterinas cada vez que lo haga. Estas podran prolongarse hasta varias semanas durante el puerperio. Las contracciones uterinas ayudan al tero a Hotel managerregresar a su tamao habitual.  Es normal tener un poco de hemorragia vaginal (loquios) despus del [place] Heightsparto. La cantidad y apariencia de los loquios a menudo es similar a las del perodo menstrual la primera semana despus del [place]parto. Disminuir gradualmente las siguientes semanas hasta convertirse en una descarga seca amarronada o Lennoxamarillenta. En la Lennar Corporationmayora de las mujeres, los loquios se detienen Guardian Life Insurancecompletamente entre 6 a 8semanas despus del [place]o. Los sangrados vaginales pueden variar de mujer a Nurse, learning disabilitymujer.  Los primeros [address]das despus del parto, podra padecer [place] Citycongestin mamaria. Los pechos se sentirn pesados, llenos y molestos. Las mamas tambin podran latir y ponerse duras, muy  tirantes, calientes y sensibles al tacto. Cuando esto [place]ocurra, podra notar Mirantleche que se escapa de los senos.El mdico puede recomendarle algunos mtodos para Emergency planning/management officeraliviar este malestar causado por la [place]villecongestin mamaria. La congestin mamaria debera desaparecer al cabo de The Mutual of Omahaunos das.  Podra sentirse ms deprimida o preocupada que lo habitual debido a los cambios hormonales luego del [place]parto. Estos sentimientos no deben durar ms de Hughes Supplyunos pocos das. Si no desaparecen al cabo de Time Warneralgunos das, hable con su mdico. QU CUIDADOS DEBO TENER?  Infrmele a su mdico si siente dolor o malestar.  Beba suficiente agua para mantener la orina clara o de color amarillo plido.  Lvese bien las manos con agua y jabn durante al menos 20segundos despus de cambiar el apsito sanitario, usar el bao o antes de sostener o Corporate treasureralimentar al beb.  Si no est amamantando, evite tocarse mucho los senos. Al hacerlo, podran producir ms WPS Resourcesleche.  Si se siente dbil o mareada, o si siente que est a punto de [address]desmayarse, pida ayuda antes de realizar lo siguiente:  Levantarse de la cama.  Ducharse.  Cambie los apsitos sanitarios con frecuencia. Observe si hay cambios en el flujo, como un aumento repentino en el volumen, cambios en el color o cogulos sanguneos de gran tamao. Si expulsa un cogulo sanguneo por la vagina, gurdelo para mostrrselo a su  mdico. No tire la cadena sin que el mdico examine el cogulo antes.  Asegrese de tener todas las vacunas al da. Esto la ayudar a Theme park managerestar protegida y a proteger al beb de determinadas enfermedades. Podra necesitar vacunas antes de dejar el hospital.  Si lo desea, hable con el mdico acerca de los mtodos de planificacin familiar o control de la natalidad (mtodos anticonceptivos). CMO PUEDO ESTABLECER LAZOS CON MI BEB? Pasar tanto tiempo como le sea posible con el beb es sumamente importante. Durante ese tiempo, usted y su beb pueden conocerse y Theme park managerdesarrollar lazos.  Tener al beb con usted en la habitacin le dar tiempo de conocerlo. Esto tambin puede hacerla sentir ms cmoda para atender al beb. Amamantar tambin puede ayudarla a crear lazos con el beb. CMO PUEDO PLANIFICAR MI REGRESO A CASA CON EL BEB?  Asegrese de tener instalada una butaca en el automvil.  La butaca debe contar con la certificacin del fabricante para asegurarse de que est instalada en forma segura.  Asegrese de que el beb quede bien asegurado en la butaca.  Pregntele al mdico todo lo que necesite saber sobre los cuidados de su beb. Asegrese de poder comunicarse con el mdico en caso de que tenga preguntas luego de dejar el hospital. Esta informacin no tiene Theme park managercomo fin reemplazar el consejo del mdico. Asegrese de hacerle al mdico cualquier pregunta que tenga. Document Released: 03/25/2007 Document Revised: 09/19/2015 Document Reviewed: 05/02/2015 Elsevier Interactive Patient Education  2017 ArvinMeritorElsevier Inc.

## 2016-05-29 ENCOUNTER — Encounter: Payer: Managed Care, Other (non HMO) | Admitting: Obstetrics & Gynecology

## 2016-05-31 ENCOUNTER — Encounter: Payer: Managed Care, Other (non HMO) | Admitting: Advanced Practice Midwife

## 2016-06-01 DIAGNOSIS — Z029 Encounter for administrative examinations, unspecified: Secondary | ICD-10-CM

## 2016-07-04 ENCOUNTER — Telehealth: Payer: Self-pay | Admitting: Obstetrics and Gynecology

## 2016-07-04 NOTE — Telephone Encounter (Signed)
Spoke with patient who states she is requesting 2 more weeks for maternity leave because she is not feeling ready to go back to work. Six week PP appointment rescheduled so patient could be evaluated. Informed patient to request letter at that time if provider felt it was appropriate. Pt verbalized understanding.

## 2016-07-04 NOTE — Telephone Encounter (Signed)
Pt called stating that her Job asked for a letter stating why she needed to stay out after her delivery until 2/19. Please contact pt

## 2016-07-09 ENCOUNTER — Ambulatory Visit (INDEPENDENT_AMBULATORY_CARE_PROVIDER_SITE_OTHER): Payer: Managed Care, Other (non HMO) | Admitting: Women's Health

## 2016-07-09 ENCOUNTER — Encounter: Payer: Self-pay | Admitting: Women's Health

## 2016-07-09 DIAGNOSIS — R1011 Right upper quadrant pain: Secondary | ICD-10-CM

## 2016-07-09 NOTE — Patient Instructions (Addendum)
Gallbladder ultrasound Wednesday 1/31 at 9:00am at [facility], be there at 8:45 am. Nothing to eat or drink after midnight the night before  NO SEX UNTIL AFTER YOU GET YOUR BIRTH CONTROL   Tips To Increase Milk Supply  Lots of water! Enough so that your urine is clear  Plenty of calories, if you're not getting enough calories, your milk supply can decrease  Breastfeed/pump often, every 2-3 hours x 20-5030mins  Fenugreek 3 pills 3 times a day, this may make your urine smell like maple syrup  Mother's Milk Tea  Lactation cookies, google for the recipe  Real oatmeal

## 2016-07-09 NOTE — Progress Notes (Signed)
Subjective:    Sheena Wagner is a 36 y.o. [id number] Hispanic female who presents for a postpartum visit. She is 6 weeks postpartum following a vaginal birth after cesarean (VBAC) at 38.4 gestational weeks after IOL for pre-eclampsia, was d/c'd on norvasc which she is no longer taking Anesthesia: none. I have fully reviewed the prenatal and intrapartum course. Postpartum course has been complicated by RUQ pain radiating to back w/ n/v- states this pain first began in 2017 and has been intermittent, had bad episode yesterday after eating chicken nuggets. Baby's course has been uncomplicated. Baby is feeding by both. Bleeding no bleeding. Bowel function is normal. Bladder function is normal. Patient is not sexually active. Last sexual activity: prior to birth of baby. Contraception method is wants nexplanon. Postpartum depression screening: negative. Score 0.  Last pap 10/11/ and was neg w/ -HRHPV.  The following portions of the patient's history were reviewed and updated as appropriate: allergies, current medications, past medical history, past surgical history and problem list.  Review of Systems Pertinent items are noted in HPI.   Vitals:   07/09/16 0854  BP: 121/77  Pulse: 82  Weight: 139 lb (63 kg)   No LMP recorded.  Objective:   General:  alert, cooperative and no distress   Breasts:  deferred, no complaints  Lungs: clear to auscultation bilaterally  Heart:  regular rate and rhythm  Abdomen: soft, +RUQ tenderness to palpation   Vulva: normal  Vagina: normal vagina  Cervix:  closed  Corpus: Well-involuted  Adnexa:  Non-palpable  Rectal Exam: No hemorrhoids        Assessment:   Postpartum exam 6 wks s/p SVB after IOL for pre-e Breast and bottlefeeding Intermittent RUQ pain radiating to back w/ n/v Depression screening Contraception counseling   Plan:  Contraception: abstinence until nexplanon Follow up in: this week for nexplanon insertion  CBC, CMP, Amylase, Lipase  today RUQ u/s scheduled for 1/31 @ 0900 @ [facility], NPO after MN  Marge DuncansBooker, [name] CNM, WHNP-BC 07/09/2016 9:02 AM

## 2016-07-10 LAB — COMPREHENSIVE METABOLIC PANEL
ALK PHOS: 101 IU/L (ref 39–117)
ALT: 39 IU/L — AB (ref 0–32)
AST: 27 IU/L (ref 0–40)
Albumin/Globulin Ratio: 1.4 (ref 1.2–2.2)
Albumin: 4.6 g/dL (ref 3.5–5.5)
BILIRUBIN TOTAL: 0.6 mg/dL (ref 0.0–1.2)
BUN/Creatinine Ratio: 23 (ref 9–23)
BUN: 11 mg/dL (ref 6–20)
CHLORIDE: 100 mmol/L (ref 96–106)
CO2: 26 mmol/L (ref 18–29)
CREATININE: 0.48 mg/dL — AB (ref 0.57–1.00)
Calcium: 9.2 mg/dL (ref 8.7–10.2)
GFR calc Af Amer: 147 mL/min/{1.73_m2} (ref >59)
GFR calc non Af Amer: 127 mL/min/{1.73_m2} (ref >59)
GLUCOSE: 92 mg/dL (ref 65–99)
Globulin, Total: 3.3 g/dL (ref 1.5–4.5)
Potassium: 3.9 mmol/L (ref 3.5–5.2)
Sodium: 142 mmol/L (ref 134–144)
Total Protein: 7.9 g/dL (ref 6.0–8.5)

## 2016-07-10 LAB — CBC
Hematocrit: 41.9 % (ref 34.0–46.6)
Hemoglobin: 13.3 g/dL (ref 11.1–15.9)
MCH: 27.1 pg (ref 26.6–33.0)
MCHC: 31.7 g/dL (ref 31.5–35.7)
MCV: 85 fL (ref 79–97)
PLATELETS: 365 10*3/uL (ref 150–379)
RBC: 4.91 x10E6/uL (ref 3.77–5.28)
RDW: 13.5 % (ref 12.3–15.4)
WBC: 5.4 10*3/uL (ref 3.4–10.8)

## 2016-07-10 LAB — LIPASE: LIPASE: 45 U/L (ref 14–72)

## 2016-07-10 LAB — AMYLASE: AMYLASE: 65 U/L (ref 31–124)

## 2016-07-11 ENCOUNTER — Ambulatory Visit (HOSPITAL_COMMUNITY)
Admission: RE | Admit: 2016-07-11 | Discharge: 2016-07-11 | Disposition: A | Discharge status: Home / Self Care | Payer: Managed Care, Other (non HMO) | Source: Ambulatory Visit | Attending: Women's Health | Admitting: Women's Health

## 2016-07-11 DIAGNOSIS — R1011 Right upper quadrant pain: Secondary | ICD-10-CM | POA: Insufficient documentation

## 2016-07-11 DIAGNOSIS — R112 Nausea with vomiting, unspecified: Secondary | ICD-10-CM | POA: Insufficient documentation

## 2016-07-11 DIAGNOSIS — M549 Dorsalgia, unspecified: Secondary | ICD-10-CM | POA: Insufficient documentation

## 2016-07-11 DIAGNOSIS — K8020 Calculus of gallbladder without cholecystitis without obstruction: Secondary | ICD-10-CM | POA: Diagnosis not present

## 2016-07-13 ENCOUNTER — Ambulatory Visit: Payer: Managed Care, Other (non HMO) | Admitting: Women's Health

## 2016-07-19 ENCOUNTER — Encounter: Payer: Managed Care, Other (non HMO) | Admitting: Women's Health

## 2016-08-13 ENCOUNTER — Encounter: Payer: Self-pay | Admitting: Women's Health

## 2017-05-23 ENCOUNTER — Ambulatory Visit: Payer: Managed Care, Other (non HMO) | Admitting: Women's Health

## 2017-06-10 ENCOUNTER — Ambulatory Visit: Payer: Managed Care, Other (non HMO) | Admitting: Obstetrics & Gynecology

## 2021-04-09 ENCOUNTER — Emergency Department (HOSPITAL_COMMUNITY): Payer: Self-pay

## 2021-04-09 ENCOUNTER — Encounter (HOSPITAL_COMMUNITY): Payer: Self-pay

## 2021-04-09 ENCOUNTER — Emergency Department (HOSPITAL_COMMUNITY)
Admission: EM | Admit: 2021-04-09 | Discharge: 2021-04-09 | Disposition: A | Discharge status: Home / Self Care | Payer: Self-pay | Attending: Emergency Medicine | Admitting: Emergency Medicine

## 2021-04-09 DIAGNOSIS — Z79899 Other long term (current) drug therapy: Secondary | ICD-10-CM | POA: Insufficient documentation

## 2021-04-09 DIAGNOSIS — I10 Essential (primary) hypertension: Secondary | ICD-10-CM | POA: Insufficient documentation

## 2021-04-09 DIAGNOSIS — K8020 Calculus of gallbladder without cholecystitis without obstruction: Secondary | ICD-10-CM | POA: Insufficient documentation

## 2021-04-09 LAB — CBC WITH DIFFERENTIAL/PLATELET
Abs Immature Granulocytes: 0.04 10*3/uL (ref 0.00–0.07)
Basophils Absolute: 0 10*3/uL (ref 0.0–0.1)
Basophils Relative: 0 %
Eosinophils Absolute: 0.1 10*3/uL (ref 0.0–0.5)
Eosinophils Relative: 1 %
HCT: 41.6 % (ref 36.0–46.0)
Hemoglobin: 14.3 g/dL (ref 12.0–15.0)
Immature Granulocytes: 0 %
Lymphocytes Relative: 16 %
Lymphs Abs: 2 10*3/uL (ref 0.7–4.0)
MCH: 29.5 pg (ref 26.0–34.0)
MCHC: 34.4 g/dL (ref 30.0–36.0)
MCV: 86 fL (ref 80.0–100.0)
Monocytes Absolute: 0.6 10*3/uL (ref 0.1–1.0)
Monocytes Relative: 5 %
Neutro Abs: 9.6 10*3/uL — ABNORMAL HIGH (ref 1.7–7.7)
Neutrophils Relative %: 78 %
Platelets: 295 10*3/uL (ref 150–400)
RBC: 4.84 MIL/uL (ref 3.87–5.11)
RDW: 12 % (ref 11.5–15.5)
WBC: 12.3 10*3/uL — ABNORMAL HIGH (ref 4.0–10.5)
nRBC: 0 % (ref 0.0–0.2)

## 2021-04-09 LAB — COMPREHENSIVE METABOLIC PANEL
ALT: 77 U/L — ABNORMAL HIGH (ref 0–44)
AST: 92 U/L — ABNORMAL HIGH (ref 15–41)
Albumin: 4.2 g/dL (ref 3.5–5.0)
Alkaline Phosphatase: 85 U/L (ref 38–126)
Anion gap: 9 (ref 5–15)
BUN: 14 mg/dL (ref 6–20)
CO2: 25 mmol/L (ref 22–32)
Calcium: 8.8 mg/dL — ABNORMAL LOW (ref 8.9–10.3)
Chloride: 103 mmol/L (ref 98–111)
Creatinine, Ser: 0.52 mg/dL (ref 0.44–1.00)
GFR, Estimated: >60 mL/min (ref >60)
Glucose, Bld: 152 mg/dL — ABNORMAL HIGH (ref 70–99)
Potassium: 3.2 mmol/L — ABNORMAL LOW (ref 3.5–5.1)
Sodium: 137 mmol/L (ref 135–145)
Total Bilirubin: 0.7 mg/dL (ref 0.3–1.2)
Total Protein: 7.5 g/dL (ref 6.5–8.1)

## 2021-04-09 LAB — URINALYSIS, ROUTINE W REFLEX MICROSCOPIC
Bilirubin Urine: NEGATIVE
Glucose, UA: NEGATIVE mg/dL
Ketones, ur: NEGATIVE mg/dL
Leukocytes,Ua: NEGATIVE
Nitrite: NEGATIVE
Protein, ur: NEGATIVE mg/dL
Specific Gravity, Urine: 1.015 (ref 1.005–1.030)
pH: 8 (ref 5.0–8.0)

## 2021-04-09 LAB — LIPASE, BLOOD: Lipase: 46 U/L (ref 11–51)

## 2021-04-09 LAB — POC URINE PREG, ED: Preg Test, Ur: NEGATIVE

## 2021-04-09 MED ORDER — ONDANSETRON HCL 4 MG/2ML IJ SOLN
4.0000 mg | Freq: Once | INTRAMUSCULAR | Status: AC
  Administered 2021-04-09: 20:00:00 4 mg via INTRAVENOUS
  Filled 2021-04-09: qty 2

## 2021-04-09 MED ORDER — MORPHINE SULFATE (PF) 4 MG/ML IV SOLN
4.0000 mg | Freq: Once | INTRAVENOUS | Status: AC
Start: 2021-04-09 — End: 2021-04-09
  Administered 2021-04-09: 20:00:00 4 mg via INTRAVENOUS
  Filled 2021-04-09: qty 1

## 2021-04-09 MED ORDER — HYDROCODONE-ACETAMINOPHEN 5-325 MG PO TABS: 1.0000 | ORAL_TABLET | ORAL | 0 refills | Status: DC | PRN

## 2021-04-09 MED ORDER — POTASSIUM CHLORIDE CRYS ER 20 MEQ PO TBCR
40.0000 meq | EXTENDED_RELEASE_TABLET | Freq: Once | ORAL | Status: AC
  Administered 2021-04-09: 23:00:00 40 meq via ORAL
  Filled 2021-04-09: qty 2

## 2021-04-09 MED ORDER — IOHEXOL 300 MG/ML  SOLN
100.0000 mL | Freq: Once | INTRAMUSCULAR | Status: AC | PRN
Start: 2021-04-09 — End: 2021-04-09
  Administered 2021-04-09: 21:00:00 100 mL via INTRAVENOUS

## 2021-04-09 NOTE — Discharge Instructions (Signed)
As discussed, you do have gallstones in your gallbladder and tomorrow's ultrasound will help [place] decide how urgently you may need to have surgery to resolve this problem.  Call the phone number given tomorrow morning to schedule your ultrasound time.  In the interim you may take the medication you received here if needed for return of your pain.  You will be guided with further instructions tomorrow after your ultrasound.

## 2021-04-09 NOTE — ED Provider Notes (Signed)
[facility] EMERGENCY DEPARTMENT Provider Note   CSN: [id number] Arrival date & time: 04/09/21  1803     History Chief Complaint  Patient presents with   Abdominal Pain    Sheena Wagner is a 40 y.o. female presenting with a 3-week history of intermittent right upper quadrant pain which has worsened today, hence her presentation here.  She describes pain which is sharp and radiates into her back and is frequently triggered by eating.  She also endorses nausea with emesis and has been afebrile.  She was told about 5 years ago that she had "mud" in her gallbladder 5 years ago when she was pregnant with her last child but she has been relatively symptom-free until recently.  She has had no fevers or chills, no diarrhea, no dysuria, no flank pain.  She has had no treatments for her symptoms prior to arrival.  The history is provided by the patient and a relative. The history is limited by a language barrier. A language interpreter was used.       Past Medical History:  Diagnosis Date   DIC (disseminated intravascular coagulation) (HCC)    History of HELLP syndrome, currently pregnant    History of stillbirth    Hypertension    only w/pregnancy   Missed periods 08/02/2015   Nausea 08/02/2015   Placental abruption    Had preX, HELLP   Stomach pain 08/02/2015    Patient Active Problem List   Diagnosis Date Noted   Severe preeclampsia 05/25/2016   Vulvar lesion 02/15/2016   Prior pregnancy with fetal demise, antepartum 11/09/2015   History of DIC syndrome 11/09/2015   Hx of preeclampsia, prior pregnancy, currently pregnant 10/13/2015   History of successful vaginal birth after cesarean, currently pregnant 10/13/2015   Stomach pain 08/02/2015    Past Surgical History:  Procedure Laterality Date   CESAREAN SECTION  2003     OB History     Gravida  7   Para  5   Term  5   Preterm      AB  2   Living  4      SAB  2   IAB      Ectopic      Multiple  0    Live Births  4        Obstetric Comments  Previus C/S scar noted as low vertical abdominal         Family History  Problem Relation Age of Onset   Hypertension Brother    Hypertension Brother     Social History   Tobacco Use   Smoking status: Never   Smokeless tobacco: Never  Substance Use Topics   Alcohol use: No   Drug use: No    Home Medications Prior to Admission medications   Medication Sig Start Date End Date Taking? Authorizing Provider  acetaminophen (TYLENOL) 325 MG tablet Take 2 tablets (650 mg total) by mouth every 4 (four) hours as needed (for pain scale < 4). 05/28/16  Yes Arabella Merles, CNM  HYDROcodone-acetaminophen (NORCO/VICODIN) 5-325 MG tablet Take 1 tablet by mouth every 4 (four) hours as needed. 04/09/21  Yes Bina Veenstra, Raynelle Fanning, PA-C  Vitamin D, Ergocalciferol, (DRISDOL) 1.25 MG (50000 UNIT) CAPS capsule Take 50,000 Units by mouth See admin instructions. Take 50,000 units every week on Thursday   Yes [provider]  amLODipine (NORVASC) 10 MG tablet Take 1 tablet (10 mg total) by mouth daily. Patient not taking: No sig reported 05/28/16  Arabella Merles, CNM  Prenatal Vit-Fe Fumarate-FA (PNV PRENATAL PLUS MULTIVITAMIN) 27-1 MG TABS Take 1 tablet by mouth daily. Patient not taking: Reported on 04/09/2021 10/13/15   Jacklyn Shell, CNM    Allergies    Patient has no known allergies.  Review of Systems   Review of Systems  Constitutional:  Negative for chills and fever.  HENT:  Negative for congestion and sore throat.   Eyes: Negative.   Respiratory:  Negative for chest tightness and shortness of breath.   Cardiovascular:  Negative for chest pain.  Gastrointestinal:  Positive for abdominal pain, nausea and vomiting.  Genitourinary: Negative.   Musculoskeletal:  Negative for arthralgias, joint swelling and neck pain.  Skin: Negative.  Negative for rash and wound.  Neurological:  Negative for dizziness, weakness,  light-headedness, numbness and headaches.  Psychiatric/Behavioral: Negative.    All other systems reviewed and are negative.  Physical Exam Updated Vital Signs BP (!) 137/99 (BP Location: Right Arm)   Pulse 81   Temp 98.2 F (36.8 C) (Oral)   Resp 16   Ht 5\' 1"  (1.549 m)   Wt 73.5 kg   SpO2 99%   BMI 30.61 kg/m   Physical Exam Vitals and nursing note reviewed.  Constitutional:      Appearance: She is well-developed.  HENT:     Head: Normocephalic and atraumatic.  Eyes:     Conjunctiva/sclera: Conjunctivae normal.  Cardiovascular:     Rate and Rhythm: Normal rate and regular rhythm.     Heart sounds: Normal heart sounds.  Pulmonary:     Effort: Pulmonary effort is normal.     Breath sounds: Normal breath sounds. No wheezing.  Abdominal:     General: A surgical scar is present. Bowel sounds are normal.     Palpations: Abdomen is soft.     Tenderness: There is abdominal tenderness in the right upper quadrant. There is no guarding. Positive signs include Murphy's sign.     Comments: Vertical surgical scar inferior to umbilicus.  Musculoskeletal:        General: Normal range of motion.     Cervical back: Normal range of motion.  Skin:    General: Skin is warm and dry.  Neurological:     Mental Status: She is alert.    ED Results / Procedures / Treatments   Labs (all labs ordered are listed, but only abnormal results are displayed) Labs Reviewed  CBC WITH DIFFERENTIAL/PLATELET - Abnormal; Notable for the following components:      Result Value   WBC 12.3 (*)    Neutro Abs 9.6 (*)    All other components within normal limits  COMPREHENSIVE METABOLIC PANEL - Abnormal; Notable for the following components:   Potassium 3.2 (*)    Glucose, Bld 152 (*)    Calcium 8.8 (*)    AST 92 (*)    ALT 77 (*)    All other components within normal limits  URINALYSIS, ROUTINE W REFLEX MICROSCOPIC - Abnormal; Notable for the following components:   APPearance CLOUDY (*)    Hgb  urine dipstick MODERATE (*)    Bacteria, UA RARE (*)    All other components within normal limits  LIPASE, BLOOD  POC URINE PREG, ED    EKG None  Radiology CT ABDOMEN PELVIS W CONTRAST  Result Date: 04/09/2021 CLINICAL DATA:  Abdominal pain, biliary obstruction suspected (Ped 0-18y) Patient reports abdominal pain for 3 weeks. Episode of vomiting during CT. EXAM: CT ABDOMEN AND PELVIS  WITH CONTRAST TECHNIQUE: Multidetector CT imaging of the abdomen and pelvis was performed using the standard protocol following bolus administration of intravenous contrast. CONTRAST:  OMNIPAQUE IOHEXOL 300 MG/ML  SOLN COMPARISON:  None. FINDINGS: Lower chest: Hypoventilatory changes at the lung bases. Fluid within the distal esophagus, patient vomited shortly after initial acquisition. Hepatobiliary: Diffusely decreased hepatic density. The liver parenchyma is heterogeneous. Multiple intraluminal gallstones. Limited assessment for wall thickening is there is mild motion artifact. Common bile duct is difficult to define, but does not appear distended. Pancreas: No ductal dilatation or inflammation. Spleen: Normal in size without focal abnormality. Adrenals/Urinary Tract: Normal adrenal glands. No hydronephrosis or perinephric edema. 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 Homogeneous renal enhancement with symmetric excretion on delayed phase imaging. Urinary bladder is physiologically distended without wall thickening. Stomach/Bowel: The stomach is prominently distended with fluid. Fluid distends the distal esophagus, patient vomited shortly thereafter. There is no definite gastric wall thickening. The duodenum is decompressed. There is no evidence of small bowel wall thickening or inflammatory change. No abnormal small distension or obstruction. Normal appendix. Small to moderate colonic stool burden without inflammation. Vascular/Lymphatic: Abdominal aorta is normal in caliber. Mild dilatation  of the left greater than right ovarian vein, 8 mm on the left and 5 mm on the right. No enlarged lymph nodes in the abdomen or pelvis. Reproductive: The uterus is retroverted. Ovaries are symmetric in size and unremarkable. Other: No free air, free fluid, or intra-abdominal fluid collection. Musculoskeletal: There are no acute or suspicious osseous abnormalities. IMPRESSION: 1. Distended fluid-filled stomach with fluid distending the distal esophagus, patient vomited shortly after initial acquisition. 2. Cholelithiasis. Limited assessment for gallbladder wall thickening due to motion artifact, although the gallbladder does not appear prominently inflamed. There is no biliary dilatation. 3. Hepatic steatosis. Heterogeneous hepatic parenchyma can be seen with steatosis or hepatitis. 4. Mild dilatation of the left greater than right ovarian vein, can be seen with pelvic congestion syndrome in the appropriate clinical setting. Electronically Signed   By: Narda Rutherford M.D.   On: 04/09/2021 21:39    Procedures Procedures   Medications Ordered in ED Medications  potassium chloride SA (KLOR-CON) CR tablet 40 mEq (has no administration in time range)  morphine 4 MG/ML injection 4 mg (4 mg Intravenous Given 04/09/21 1957)  ondansetron (ZOFRAN) injection 4 mg (4 mg Intravenous Given 04/09/21 1958)  iohexol (OMNIPAQUE) 300 MG/ML solution 100 mL (100 mLs Intravenous Contrast Given 04/09/21 2106)    ED Course  I have reviewed the triage vital signs and the nursing notes.  Pertinent labs & imaging results that were available during my care of the patient were reviewed by me and considered in my medical decision making (see chart for details).    MDM Rules/Calculators/A&P                           Labs and imaging reviewed and discussed with patient and son at the bedside.  She is currently symptom-free and is comfortable.  We reviewed her labs and she does have a mild bump in her LFTs, her lipase and  bilirubin are normal.  We discussed admission with plans to get an ultrasound in the morning versus getting an ultrasound as an outpatient as she is currently symptom-free.  Patient is motivated to go home with plans to return in the morning for an ultrasound.  This has been scheduled as an outpatient.  She is also given  a prepack of hydrocodone in case her symptoms return.  I discussed with her and the son that she will need to call to schedule the appointment time and that further management will be determined depending on the ultrasound results, she was given Dr. Lovell Sheehan information for hopeful outpatient follow-up for her gallbladder concerns.  Patient discussed with Dr. Estell Harpin prior to discharge home. Final Clinical Impression(s) / ED Diagnoses Final diagnoses:  Gallstones    Rx / DC Orders ED Discharge Orders          Ordered    HYDROcodone-acetaminophen (NORCO/VICODIN) 5-325 MG tablet  Every 4 hours PRN        04/09/21 2239    US Abdomen Limited RUQ/Gall Gladder        04/09/21 2240             Burgess Amor, PA-C 04/09/21 2250    Bethann Berkshire, MD 04/10/21 1041

## 2021-04-09 NOTE — ED Triage Notes (Signed)
Pt. Has complaints of abdominal pain for 3 weeks. Pt. States the pain has increased and is unbearable now.

## 2021-04-10 ENCOUNTER — Ambulatory Visit (HOSPITAL_COMMUNITY)
Admission: RE | Admit: 2021-04-10 | Discharge: 2021-04-10 | Disposition: A | Discharge status: Home / Self Care | Payer: Self-pay | Source: Ambulatory Visit | Attending: Emergency Medicine | Admitting: Emergency Medicine

## 2021-04-10 ENCOUNTER — Other Ambulatory Visit: Payer: Self-pay

## 2021-04-10 DIAGNOSIS — K760 Fatty (change of) liver, not elsewhere classified: Secondary | ICD-10-CM | POA: Insufficient documentation

## 2021-04-10 DIAGNOSIS — K8020 Calculus of gallbladder without cholecystitis without obstruction: Secondary | ICD-10-CM | POA: Insufficient documentation

## 2021-04-10 MED FILL — Hydrocodone-Acetaminophen Tab 5-325 MG: ORAL | Qty: 6 | Status: AC

## 2021-04-13 ENCOUNTER — Encounter: Payer: Self-pay | Admitting: General Surgery

## 2021-04-13 ENCOUNTER — Ambulatory Visit (INDEPENDENT_AMBULATORY_CARE_PROVIDER_SITE_OTHER): Payer: Self-pay | Admitting: General Surgery

## 2021-04-13 ENCOUNTER — Other Ambulatory Visit: Payer: Self-pay

## 2021-04-13 VITALS — BP 127/90 | HR 82 | Temp 98.3°F | Resp 12 | Ht 61.0 in | Wt 156.0 lb

## 2021-04-13 DIAGNOSIS — K8020 Calculus of gallbladder without cholecystitis without obstruction: Secondary | ICD-10-CM

## 2021-04-13 NOTE — Progress Notes (Signed)
Sheena Wagner; [id number]; [birth year]   HPI Patient is a 40 year old Hispanic female who was referred to my care by the emergency room for evaluation treatment of cholecystitis secondary to cholelithiasis.  Patient was seen in the emergency room recently and diagnosed with cholecystitis with cholelithiasis.  She has a known history of cholelithiasis.  She has had approximately 4 episodes of biliary colic over the past year.  She develops right upper quadrant abdominal pain with nausea and bloating.  She currently is asymptomatic today.  She denies any fever, chills, or jaundice. Past Medical History:  Diagnosis Date   DIC (disseminated intravascular coagulation) (HCC)    History of HELLP syndrome, currently pregnant    History of stillbirth    Hypertension    only w/pregnancy   Missed periods 08/02/2015   Nausea 08/02/2015   Placental abruption    Had preX, HELLP   Stomach pain 08/02/2015    Past Surgical History:  Procedure Laterality Date   CESAREAN SECTION  2003    Family History  Problem Relation Age of Onset   Hypertension Brother    Hypertension Brother     Current Outpatient Medications on File Prior to Visit  Medication Sig Dispense Refill   HYDROcodone-acetaminophen (NORCO/VICODIN) 5-325 MG tablet Take 1 tablet by mouth every 4 (four) hours as needed. 6 tablet 0   Vitamin D, Ergocalciferol, (DRISDOL) 1.25 MG (50000 UNIT) CAPS capsule Take 50,000 Units by mouth See admin instructions. Take 50,000 units every week on Thursday     acetaminophen (TYLENOL) 325 MG tablet Take 2 tablets (650 mg total) by mouth every 4 (four) hours as needed (for pain scale < 4).     amLODipine (NORVASC) 10 MG tablet Take 1 tablet (10 mg total) by mouth daily. (Patient not taking: No sig reported) 30 tablet 1   Prenatal Vit-Fe Fumarate-FA (PNV PRENATAL PLUS MULTIVITAMIN) 27-1 MG TABS Take 1 tablet by mouth daily. (Patient not taking: No sig reported) 30 tablet 11   No current  facility-administered medications on file prior to visit.    No Known Allergies  Social History   Substance and Sexual Activity  Alcohol Use No    Social History   Tobacco Use  Smoking Status Never  Smokeless Tobacco Never    Review of Systems  Constitutional: Negative.   HENT: Negative.    Eyes: Negative.   Respiratory: Negative.    Cardiovascular: Negative.   Gastrointestinal:  Positive for abdominal pain.  Genitourinary: Negative.   Musculoskeletal: Negative.   Skin: Negative.   Neurological: Negative.   Endo/Heme/Allergies: Negative.   Psychiatric/Behavioral: Negative.     Objective   Vitals:   04/13/21 1100  BP: 127/90  Pulse: 82  Resp: 12  Temp: 98.3 F (36.8 C)  SpO2: 98%    Physical Exam Vitals reviewed.  Constitutional:      Appearance: Normal appearance. She is normal weight. She is not ill-appearing.  HENT:     Head: Normocephalic and atraumatic.  Eyes:     General: No scleral icterus. Cardiovascular:     Rate and Rhythm: Normal rate and regular rhythm.     Heart sounds: Normal heart sounds. No murmur heard.   No friction rub. No gallop.  Pulmonary:     Effort: Pulmonary effort is normal. No respiratory distress.     Breath sounds: Normal breath sounds. No stridor. No wheezing, rhonchi or rales.  Abdominal:     General: Bowel sounds are normal. There is no distension.  Palpations: Abdomen is soft. There is no mass.     Tenderness: There is no abdominal tenderness. There is no guarding or rebound.     Hernia: No hernia is present.  Skin:    General: Skin is warm and dry.  Neurological:     Mental Status: She is alert and oriented to person, place, and time.  ER notes reviewed, ultrasound report reviewed  Assessment  Cholecystitis, cholelithiasis Plan  Patient is scheduled for laparoscopic cholecystectomy on 04/28/2021.  The risks and benefits of the procedure including bleeding, infection, hepatobiliary injury, and the possibility  of an open procedure were fully explained to the patient, who gave informed consent.

## 2021-04-13 NOTE — H&P (Signed)
Sheena Wagner; [id number]; 1980-11-07   HPI Patient is a 40 year old Hispanic female who was referred to my care by the emergency room for evaluation treatment of cholecystitis secondary to cholelithiasis.  Patient was seen in the emergency room recently and diagnosed with cholecystitis with cholelithiasis.  She has a known history of cholelithiasis.  She has had approximately 4 episodes of biliary colic over the past year.  She develops right upper quadrant abdominal pain with nausea and bloating.  She currently is asymptomatic today.  She denies any fever, chills, or jaundice. Past Medical History:  Diagnosis Date   DIC (disseminated intravascular coagulation) (HCC)    History of HELLP syndrome, currently pregnant    History of stillbirth    Hypertension    only w/pregnancy   Missed periods 08/02/2015   Nausea 08/02/2015   Placental abruption    Had preX, HELLP   Stomach pain 08/02/2015    Past Surgical History:  Procedure Laterality Date   CESAREAN SECTION  2003    Family History  Problem Relation Age of Onset   Hypertension Brother    Hypertension Brother     Current Outpatient Medications on File Prior to Visit  Medication Sig Dispense Refill   HYDROcodone-acetaminophen (NORCO/VICODIN) 5-325 MG tablet Take 1 tablet by mouth every 4 (four) hours as needed. 6 tablet 0   Vitamin D, Ergocalciferol, (DRISDOL) 1.25 MG (50000 UNIT) CAPS capsule Take 50,000 Units by mouth See admin instructions. Take 50,000 units every week on Thursday     acetaminophen (TYLENOL) 325 MG tablet Take 2 tablets (650 mg total) by mouth every 4 (four) hours as needed (for pain scale < 4).     amLODipine (NORVASC) 10 MG tablet Take 1 tablet (10 mg total) by mouth daily. (Patient not taking: No sig reported) 30 tablet 1   Prenatal Vit-Fe Fumarate-FA (PNV PRENATAL PLUS MULTIVITAMIN) 27-1 MG TABS Take 1 tablet by mouth daily. (Patient not taking: No sig reported) 30 tablet 11   No current  facility-administered medications on file prior to visit.    No Known Allergies  Social History   Substance and Sexual Activity  Alcohol Use No    Social History   Tobacco Use  Smoking Status Never  Smokeless Tobacco Never    Review of Systems  Constitutional: Negative.   HENT: Negative.    Eyes: Negative.   Respiratory: Negative.    Cardiovascular: Negative.   Gastrointestinal:  Positive for abdominal pain.  Genitourinary: Negative.   Musculoskeletal: Negative.   Skin: Negative.   Neurological: Negative.   Endo/Heme/Allergies: Negative.   Psychiatric/Behavioral: Negative.     Objective   Vitals:   04/13/21 1100  BP: 127/90  Pulse: 82  Resp: 12  Temp: 98.3 F (36.8 C)  SpO2: 98%    Physical Exam Vitals reviewed.  Constitutional:      Appearance: Normal appearance. She is normal weight. She is not ill-appearing.  HENT:     Head: Normocephalic and atraumatic.  Eyes:     General: No scleral icterus. Cardiovascular:     Rate and Rhythm: Normal rate and regular rhythm.     Heart sounds: Normal heart sounds. No murmur heard.   No friction rub. No gallop.  Pulmonary:     Effort: Pulmonary effort is normal. No respiratory distress.     Breath sounds: Normal breath sounds. No stridor. No wheezing, rhonchi or rales.  Abdominal:     General: Bowel sounds are normal. There is no distension.  Palpations: Abdomen is soft. There is no mass.     Tenderness: There is no abdominal tenderness. There is no guarding or rebound.     Hernia: No hernia is present.  Skin:    General: Skin is warm and dry.  Neurological:     Mental Status: She is alert and oriented to person, place, and time.  ER notes reviewed, ultrasound report reviewed  Assessment  Cholecystitis, cholelithiasis Plan  Patient is scheduled for laparoscopic cholecystectomy on 04/28/2021.  The risks and benefits of the procedure including bleeding, infection, hepatobiliary injury, and the possibility  of an open procedure were fully explained to the patient, who gave informed consent.

## 2021-04-26 ENCOUNTER — Other Ambulatory Visit: Payer: Self-pay

## 2021-04-26 ENCOUNTER — Encounter (HOSPITAL_COMMUNITY)
Admission: RE | Admit: 2021-04-26 | Discharge: 2021-04-26 | Disposition: A | Discharge status: Home / Self Care | Payer: Self-pay | Source: Ambulatory Visit | Attending: General Surgery | Admitting: General Surgery

## 2021-04-26 NOTE — Patient Instructions (Signed)
Rayni Nemitz  04/26/2021     @PREFPERIOPPHARMACY @   Your procedure is scheduled on 04/28/2021.  Report to 04/30/2021 at 8:25 A.M.  Call this number if you have problems the morning of surgery:  [phone]   Remember:  Do not eat or drink after midnight.      Take these medicines the morning of surgery with A SIP OF WATER : norco    Do not wear jewelry, make-up or nail polish.  Do not wear lotions, powders, or perfumes, or deodorant.  Do not shave 48 hours prior to surgery.  Men may shave face and neck.  Do not bring valuables to the hospital.  [facility] is not responsible for any belongings or valuables.  Contacts, dentures or bridgework may not be worn into surgery.  Leave your suitcase in the car.  After surgery it may be brought to your room.  For patients admitted to the hospital, discharge time will be determined by your treatment team.  Patients discharged the day of surgery will not be allowed to drive home.   Name and phone number of your driver:   Family Special instructions:    Colecistectoma mnimamente invasiva Minimally Invasive Cholecystectomy Una colecistectoma mnimamente invasiva es una ciruga que se realiza para extirpar la vescula biliar. La vescula biliar es un rgano que tiene forma de pera y se encuentra debajo del hgado, del lado derecho del cuerpo. La vescula biliar almacena bilis, un lquido que ayuda al organismo a digerir las grasas. La colecistectoma se realiza con frecuencia para tratar la inflamacin (irritacin e hinchazn) de la vescula biliar (colecistitis). Por lo general, esta afeccin se debe a una acumulacin de clculos biliares (colelitiasis) en la vescula biliar o al estancamiento del lquido de la vescula biliar a causa de que los clculos biliares se atascan en los conductos (tubos) y obstruyen el paso de la bilis. Esto puede producir inflamacin y [facility]. En los Engineer, mining, podr ser [employer]. Este procedimiento se realiza a travs de pequeas incisiones en el abdomen, en lugar de una incisin grande. Tambin se denomina "ciruga laparoscpica". Se introduce un endoscopio delgado que tiene [place] (laparoscopio) a travs de una incisin. A travs de las otras incisiones, se introducen los instrumentos quirrgicos. En algunos casos, es posible que Secretary/administrator mnimamente invasiva deba cambiarse a una [place] realizada a travs de una incisin ms grande. Esta se denomina "ciruga abierta". Informe al mdico acerca de lo siguiente: Cualquier alergia que tenga. Todos los [place] [employer], incluidos vitaminas, hierbas, gotas oftlmicas, cremas y [place] de [address] Problemas previos que usted o algn miembro de su familia hayan tenido con los anestsicos. Cualquier problema de la sangre que tenga. Cirugas a las que se haya sometido. Cualquier afeccin mdica que tenga. Si est embarazada o podra estarlo. Cules son los riesgos? En general, se trata de un procedimiento seguro. Sin embargo, pueden ocurrir complicaciones, por ejemplo: Infeccin. Sangrado. Reacciones alrgicas a los medicamentos. Daos a las estructuras o los rganos cercanos. Un clculo biliar que queda en el conducto biliar comn. El conducto coldoco transporta la bilis desde la vescula biliar hasta el intestino delgado. Una filtracin de bilis del hgado o del conducto qustico despus de que se extirpa la vescula biliar. Qu ocurre antes del procedimiento? Cundo dejar de comer y beber Siga las instrucciones del mdico con respecto a lo que puede comer y beber antes del procedimiento. Pueden incluir: Ocho horas antes del procedimiento Deje de  comer la [employer]. No coma carne, alimentos fritos ni alimentos grasos. Consuma solo alimentos livianos, como tostadas o Social worker. Todos los lquidos son aceptables, excepto las bebidas energticas y el alcohol. Seis horas  antes del procedimiento Deje de comer. Beba nicamente lquidos transparentes, como agua, jugo de fruta transparente, caf solo, t solo y bebidas deportivas. No consuma bebidas energticas ni alcohol. Dos horas antes del procedimiento Deje de beber todos los lquidos. Es posible que le permitan tomar medicamentos con pequeos sorbos de [place]. Si no sigue las instrucciones del mdico, el procedimiento puede retrasarse o cancelarse. Medicamentos Consulte al mdico si debe hacer o no lo siguiente: Multimedia programmer o suspender los medicamentos que [place] habitualmente. Esto es muy importante si toma medicamentos para la diabetes o anticoagulantes. Tomar medicamentos como aspirina e ibuprofeno. Estos medicamentos pueden tener un efecto anticoagulante en la [place]. No tome estos medicamentos a menos que el mdico se lo indique. Usar medicamentos de venta libre, vitaminas, hierbas y suplementos. Instrucciones generales Si va a marcharse a su casa inmediatamente despus del procedimiento, pdale a un adulto responsable que: Lo lleve a su casa desde el hospital o la clnica. No se le permitir conducir. Lo cuide durante el [employer] indiquen. No consuma ningn producto que contenga nicotina ni tabaco durante al [employer] las 4 semanas anteriores al procedimiento. Estos productos incluyen cigarrillos, tabaco para [name] y aparatos de vapeo, como los Administrator, Civil Service. Si necesita ayuda para dejar de fumar, consulte al [employer]. Pregntele al mdico: Cmo se Forensic psychologist de la Leisure centre manager. Qu medidas se tomarn para evitar una infeccin. Pueden incluir: Rasurar el vello del lugar de la ciruga. Lavar la piel con un jabn antisptico. Recibir antibiticos. Qu ocurre durante el procedimiento?  Le colocarn una va intravenosa (i.v.) en una vena. Le administrarn uno de los siguientes medicamentos o ambos: Un medicamento para ayudar a Lexicographer (sedante). Un medicamento que lo har dormir (anestesia  general). Su cirujano le har varias incisiones pequeas en el abdomen. El laparoscopio se introducir a travs de una de las pequeas incisiones. La cmara del laparoscopio enviar imgenes a un monitor que se encuentra en el quirfano. Esto permitir a su Pensions consultant del abdomen. Le inyectarn un gas en el abdomen. Esto expandir el abdomen para que el cirujano tenga ms lugar para Facilities manager. El resto del instrumental necesario para el procedimiento se introducir a travs de las otras incisiones. Se extirpar la vescula biliar a travs de una de las incisiones. Se puede examinar el conducto coldoco. Si se encuentran clculos en la va biliar, tal vez deban extirparse. Despus de la extirpacin de la vescula biliar, se cerrarn las incisiones con puntos (suturas), grapas o goma para cerrar la piel. Las incisiones pueden cubrirse con una venda (vendaje). El procedimiento puede variar segn el mdico y el hospital. Ladell Heads ocurre despus del procedimiento? Le controlarn la presin arterial, la frecuencia cardaca, la frecuencia respiratoria y Air cabin crew de oxgeno en la sangre hasta que le den el alta del hospital o la clnica. Le darn analgsicos para Human resources officer, si es necesario. Es posible que le coloquen un drenaje en la incisin. Se lo retirarn [employer] despus del procedimiento. Resumen La colecistectoma mnimamente invasiva, tambin llamada colecistectoma laparoscpica, es una ciruga que se realiza para extirpar la vescula biliar a travs de pequeas incisiones. Informe a su mdico sobre todas las otras afecciones que tenga y [employer] medicamentos que est usando para dichas  afecciones. Antes del procedimiento, siga las instrucciones sobre cundo dejar de comer y beber y Tacy Dura cambiar o suspender medicamentos. Haga que un adulto responsable lo cuide durante el tiempo que le indiquen despus de que le den el alta del hospital o de la clnica. Esta  informacin no tiene Theme park manager el consejo del mdico. Asegrese de hacerle al mdico cualquier pregunta que tenga. Document Revised: 12/14/2020 Document Reviewed: 12/14/2020 Elsevier Patient Education  2022 Elsevier Inc.   Please read over the following fact sheets that you were given. Care and Recovery After Surgery

## 2021-04-27 ENCOUNTER — Encounter (HOSPITAL_COMMUNITY): Payer: Self-pay

## 2021-04-27 ENCOUNTER — Encounter (HOSPITAL_COMMUNITY)
Admission: RE | Admit: 2021-04-27 | Discharge: 2021-04-27 | Disposition: A | Discharge status: Home / Self Care | Payer: Self-pay | Source: Ambulatory Visit | Attending: General Surgery | Admitting: General Surgery

## 2021-04-27 ENCOUNTER — Other Ambulatory Visit: Payer: Self-pay

## 2021-04-27 DIAGNOSIS — Z01818 Encounter for other preprocedural examination: Secondary | ICD-10-CM | POA: Insufficient documentation

## 2021-04-27 DIAGNOSIS — K8020 Calculus of gallbladder without cholecystitis without obstruction: Secondary | ICD-10-CM | POA: Insufficient documentation

## 2021-04-27 LAB — CBC WITH DIFFERENTIAL/PLATELET
Abs Immature Granulocytes: 0.02 10*3/uL (ref 0.00–0.07)
Basophils Absolute: 0 10*3/uL (ref 0.0–0.1)
Basophils Relative: 1 %
Eosinophils Absolute: 0.1 10*3/uL (ref 0.0–0.5)
Eosinophils Relative: 2 %
HCT: 42.4 % (ref 36.0–46.0)
Hemoglobin: 14 g/dL (ref 12.0–15.0)
Immature Granulocytes: 0 %
Lymphocytes Relative: 47 %
Lymphs Abs: 3.1 10*3/uL (ref 0.7–4.0)
MCH: 28.9 pg (ref 26.0–34.0)
MCHC: 33 g/dL (ref 30.0–36.0)
MCV: 87.6 fL (ref 80.0–100.0)
Monocytes Absolute: 0.6 10*3/uL (ref 0.1–1.0)
Monocytes Relative: 9 %
Neutro Abs: 2.7 10*3/uL (ref 1.7–7.7)
Neutrophils Relative %: 41 %
Platelets: 312 10*3/uL (ref 150–400)
RBC: 4.84 MIL/uL (ref 3.87–5.11)
RDW: 12 % (ref 11.5–15.5)
WBC: 6.6 10*3/uL (ref 4.0–10.5)
nRBC: 0 % (ref 0.0–0.2)

## 2021-04-27 LAB — COMPREHENSIVE METABOLIC PANEL
ALT: 45 U/L — ABNORMAL HIGH (ref 0–44)
AST: 28 U/L (ref 15–41)
Albumin: 4.3 g/dL (ref 3.5–5.0)
Alkaline Phosphatase: 82 U/L (ref 38–126)
Anion gap: 10 (ref 5–15)
BUN: 13 mg/dL (ref 6–20)
CO2: 21 mmol/L — ABNORMAL LOW (ref 22–32)
Calcium: 9.1 mg/dL (ref 8.9–10.3)
Chloride: 107 mmol/L (ref 98–111)
Creatinine, Ser: 0.55 mg/dL (ref 0.44–1.00)
GFR, Estimated: >60 mL/min (ref >60)
Glucose, Bld: 114 mg/dL — ABNORMAL HIGH (ref 70–99)
Potassium: 3.4 mmol/L — ABNORMAL LOW (ref 3.5–5.1)
Sodium: 138 mmol/L (ref 135–145)
Total Bilirubin: 0.6 mg/dL (ref 0.3–1.2)
Total Protein: 7.6 g/dL (ref 6.5–8.1)

## 2021-04-27 LAB — HCG, SERUM, QUALITATIVE: Preg, Serum: NEGATIVE

## 2021-04-27 NOTE — Patient Instructions (Signed)
Puja Caffey  04/27/2021     @PREFPERIOPPHARMACY @   Your procedure is scheduled on 04/28/2021.  Report to 04/30/2021 at 8:25 A.M.  Call this number if you have problems the morning of surgery:  [phone]   Remember:  Do not eat or drink after midnight.    Take these medicines the morning of surgery with A SIP OF WATER : Norco    Do not wear jewelry, make-up or nail polish.  Do not wear lotions, powders, or perfumes, or deodorant.  Do not shave 48 hours prior to surgery.  Men may shave face and neck.  Do not bring valuables to the hospital.  [facility] is not responsible for any belongings or valuables.  Contacts, dentures or bridgework may not be worn into surgery.  Leave your suitcase in the car.  After surgery it may be brought to your room.  For patients admitted to the hospital, discharge time will be determined by your treatment team.  Patients discharged the day of surgery will not be allowed to drive home.   Name and phone number of your driver:   family Special instructions:  n/a  Please read over the following fact sheets that you were given. Care and Recovery After Surgery  Colecistectoma mnimamente invasiva Minimally Invasive Cholecystectomy Una colecistectoma mnimamente invasiva es una ciruga que se realiza para extirpar la vescula biliar. La vescula biliar es un rgano que tiene forma de pera y se encuentra debajo del hgado, del lado derecho del cuerpo. La vescula biliar almacena bilis, un lquido que ayuda al organismo a digerir las grasas. La colecistectoma se realiza con frecuencia para tratar la inflamacin (irritacin e hinchazn) de la vescula biliar (colecistitis). Por lo general, esta afeccin se debe a una acumulacin de clculos biliares (colelitiasis) en la vescula biliar o al estancamiento del lquido de la vescula biliar a causa de que los clculos biliares se atascan en los conductos (tubos) y obstruyen el paso de la bilis.  Esto puede producir inflamacin y [facility]. En los Engineer, mining, podr ser [employer]. Este procedimiento se realiza a travs de pequeas incisiones en el abdomen, en lugar de una incisin grande. Tambin se denomina "ciruga laparoscpica". Se introduce un endoscopio delgado que tiene [place] (laparoscopio) a travs de una incisin. A travs de las otras incisiones, se introducen los instrumentos quirrgicos. En algunos casos, es posible que Secretary/administrator mnimamente invasiva deba cambiarse a una [place] realizada a travs de una incisin ms grande. Esta se denomina "ciruga abierta". Informe al mdico acerca de lo siguiente: Cualquier alergia que tenga. Todos los [place] [employer], incluidos vitaminas, hierbas, gotas oftlmicas, cremas y [place] de [address] Problemas previos que usted o algn miembro de su familia hayan tenido con los anestsicos. Cualquier problema de la sangre que tenga. Cirugas a las que se haya sometido. Cualquier afeccin mdica que tenga. Si est embarazada o podra estarlo. Cules son los riesgos? En general, se trata de un procedimiento seguro. Sin embargo, pueden ocurrir complicaciones, por ejemplo: Infeccin. Sangrado. Reacciones alrgicas a los medicamentos. Daos a las estructuras o los rganos cercanos. Un clculo biliar que queda en el conducto biliar comn. El conducto coldoco transporta la bilis desde la vescula biliar hasta el intestino delgado. Una filtracin de bilis del hgado o del conducto qustico despus de que se extirpa la vescula biliar. Qu ocurre antes del procedimiento? Cundo dejar de comer y beber Siga las instrucciones del mdico con respecto a lo que puede  comer y beber antes del procedimiento. Pueden incluir: Ocho horas antes del procedimiento Deje de comer la mayora de los alimentos. No coma carne, alimentos fritos ni alimentos grasos. Consuma solo alimentos livianos, como tostadas o Chiropodist. Todos los lquidos son aceptables, excepto las bebidas energticas y el alcohol. Seis horas antes del procedimiento Deje de comer. Beba nicamente lquidos transparentes, como agua, jugo de fruta transparente, caf solo, t solo y bebidas deportivas. No consuma bebidas energticas ni alcohol. Dos horas antes del procedimiento Deje de beber todos los lquidos. Es posible que le permitan tomar medicamentos con pequeos sorbos de [place]. Si no sigue las instrucciones del mdico, el procedimiento puede retrasarse o cancelarse. Medicamentos Consulte al mdico si debe hacer o no lo siguiente: Multimedia programmer o suspender los medicamentos que [place] habitualmente. Esto es muy importante si toma medicamentos para la diabetes o anticoagulantes. Tomar medicamentos como aspirina e ibuprofeno. Estos medicamentos pueden tener un efecto anticoagulante en la [place]. No tome estos medicamentos a menos que el mdico se lo indique. Usar medicamentos de venta libre, vitaminas, hierbas y suplementos. Instrucciones generales Si va a marcharse a su casa inmediatamente despus del procedimiento, pdale a un adulto responsable que: Lo lleve a su casa desde el hospital o la clnica. No se le permitir conducir. Lo cuide durante el [employer] indiquen. No consuma ningn producto que contenga nicotina ni tabaco durante al [employer] las 4 semanas anteriores al procedimiento. Estos productos incluyen cigarrillos, tabaco para [name] y aparatos de vapeo, como los Administrator, Civil Service. Si necesita ayuda para dejar de fumar, consulte al [employer]. Pregntele al mdico: Cmo se Forensic psychologist de la Leisure centre manager. Qu medidas se tomarn para evitar una infeccin. Pueden incluir: Rasurar el vello del lugar de la ciruga. Lavar la piel con un jabn antisptico. Recibir antibiticos. Qu ocurre durante el procedimiento?  Le colocarn una va intravenosa (i.v.) en una vena. Le administrarn uno de los siguientes medicamentos o  ambos: Un medicamento para ayudar a Lexicographer (sedante). Un medicamento que lo har dormir (anestesia general). Su cirujano le har varias incisiones pequeas en el abdomen. El laparoscopio se introducir a travs de una de las pequeas incisiones. La cmara del laparoscopio enviar imgenes a un monitor que se encuentra en el quirfano. Esto permitir a su Pensions consultant del abdomen. Le inyectarn un gas en el abdomen. Esto expandir el abdomen para que el cirujano tenga ms lugar para Facilities manager. El resto del instrumental necesario para el procedimiento se introducir a travs de las otras incisiones. Se extirpar la vescula biliar a travs de una de las incisiones. Se puede examinar el conducto coldoco. Si se encuentran clculos en la va biliar, tal vez deban extirparse. Despus de la extirpacin de la vescula biliar, se cerrarn las incisiones con puntos (suturas), grapas o goma para cerrar la piel. Las incisiones pueden cubrirse con una venda (vendaje). El procedimiento puede variar segn el mdico y el hospital. Ladell Heads ocurre despus del procedimiento? Le controlarn la presin arterial, la frecuencia cardaca, la frecuencia respiratoria y Air cabin crew de oxgeno en la sangre hasta que le den el alta del hospital o la clnica. Le darn analgsicos para Human resources officer, si es necesario. Es posible que le coloquen un drenaje en la incisin. Se lo retirarn [employer] despus del procedimiento. Resumen La colecistectoma mnimamente invasiva, tambin llamada colecistectoma laparoscpica, es una ciruga que se realiza para extirpar la vescula biliar a travs de pequeas incisiones. Informe a su mdico sobre todas  las otras afecciones que tenga y [place] todos los medicamentos que est usando para dichas afecciones. Antes del procedimiento, siga las instrucciones sobre cundo dejar de comer y beber y Tacy Dura cambiar o suspender medicamentos. Haga que un adulto responsable lo cuide  durante el tiempo que le indiquen despus de que le den el alta del hospital o de la clnica. Esta informacin no tiene Theme park manager el consejo del mdico. Asegrese de hacerle al mdico cualquier pregunta que tenga. Document Revised: 12/14/2020 Document Reviewed: 12/14/2020 Elsevier Patient Education  2022 [employer].

## 2021-04-28 ENCOUNTER — Ambulatory Visit (HOSPITAL_COMMUNITY): Payer: Self-pay | Admitting: Certified Registered"

## 2021-04-28 ENCOUNTER — Encounter (HOSPITAL_COMMUNITY): Payer: Self-pay | Admitting: General Surgery

## 2021-04-28 ENCOUNTER — Encounter (HOSPITAL_COMMUNITY)
Admission: RE | Disposition: A | Discharge status: Home / Self Care | Payer: Self-pay | Source: Ambulatory Visit | Attending: General Surgery

## 2021-04-28 ENCOUNTER — Ambulatory Visit (HOSPITAL_COMMUNITY)
Admission: RE | Admit: 2021-04-28 | Discharge: 2021-04-28 | Disposition: A | Discharge status: Home / Self Care | Payer: Self-pay | Source: Ambulatory Visit | Attending: General Surgery | Admitting: General Surgery

## 2021-04-28 DIAGNOSIS — I10 Essential (primary) hypertension: Secondary | ICD-10-CM | POA: Insufficient documentation

## 2021-04-28 DIAGNOSIS — K8050 Calculus of bile duct without cholangitis or cholecystitis without obstruction: Secondary | ICD-10-CM | POA: Insufficient documentation

## 2021-04-28 DIAGNOSIS — Z01818 Encounter for other preprocedural examination: Secondary | ICD-10-CM

## 2021-04-28 DIAGNOSIS — K8010 Calculus of gallbladder with chronic cholecystitis without obstruction: Secondary | ICD-10-CM | POA: Insufficient documentation

## 2021-04-28 DIAGNOSIS — K8020 Calculus of gallbladder without cholecystitis without obstruction: Secondary | ICD-10-CM

## 2021-04-28 HISTORY — PX: CHOLECYSTECTOMY: SHX55

## 2021-04-28 SURGERY — LAPAROSCOPIC CHOLECYSTECTOMY
Anesthesia: General | Site: Abdomen

## 2021-04-28 MED ORDER — PROPOFOL 10 MG/ML IV BOLUS
INTRAVENOUS | Status: DC | PRN
  Administered 2021-04-28: 170 mg via INTRAVENOUS

## 2021-04-28 MED ORDER — SUGAMMADEX SODIUM 200 MG/2ML IV SOLN
INTRAVENOUS | Status: DC | PRN
  Administered 2021-04-28: 200 mg via INTRAVENOUS

## 2021-04-28 MED ORDER — ONDANSETRON HCL 4 MG/2ML IJ SOLN: 4.0000 mg | Freq: Once | INTRAMUSCULAR | Status: DC | PRN

## 2021-04-28 MED ORDER — ROCURONIUM BROMIDE 100 MG/10ML IV SOLN
INTRAVENOUS | Status: DC | PRN
  Administered 2021-04-28: 50 mg via INTRAVENOUS

## 2021-04-28 MED ORDER — BUPIVACAINE LIPOSOME 1.3 % IJ SUSP
INTRAMUSCULAR | Status: AC
  Filled 2021-04-28: qty 20

## 2021-04-28 MED ORDER — ROCURONIUM BROMIDE 10 MG/ML (PF) SYRINGE
PREFILLED_SYRINGE | INTRAVENOUS | Status: AC
  Filled 2021-04-28: qty 10

## 2021-04-28 MED ORDER — LIDOCAINE HCL (CARDIAC) PF 100 MG/5ML IV SOSY
PREFILLED_SYRINGE | INTRAVENOUS | Status: DC | PRN
  Administered 2021-04-28: 80 mg via INTRATRACHEAL

## 2021-04-28 MED ORDER — FENTANYL CITRATE PF 50 MCG/ML IJ SOSY
25.0000 ug | PREFILLED_SYRINGE | INTRAMUSCULAR | Status: DC | PRN
  Administered 2021-04-28 (×3): 50 ug via INTRAVENOUS
  Filled 2021-04-28 (×3): qty 1

## 2021-04-28 MED ORDER — KETOROLAC TROMETHAMINE 30 MG/ML IJ SOLN
30.0000 mg | Freq: Once | INTRAMUSCULAR | Status: AC
  Administered 2021-04-28: 30 mg via INTRAVENOUS
  Filled 2021-04-28: qty 1

## 2021-04-28 MED ORDER — CHLORHEXIDINE GLUCONATE CLOTH 2 % EX PADS: 6.0000 | MEDICATED_PAD | Freq: Once | CUTANEOUS | Status: DC

## 2021-04-28 MED ORDER — DEXAMETHASONE SODIUM PHOSPHATE 10 MG/ML IJ SOLN
INTRAMUSCULAR | Status: DC | PRN
  Administered 2021-04-28: 10 mg via INTRAVENOUS

## 2021-04-28 MED ORDER — FENTANYL CITRATE (PF) 100 MCG/2ML IJ SOLN
INTRAMUSCULAR | Status: DC | PRN
  Administered 2021-04-28 (×3): 50 ug via INTRAVENOUS

## 2021-04-28 MED ORDER — HYDROCODONE-ACETAMINOPHEN 5-325 MG PO TABS: 1.0000 | ORAL_TABLET | ORAL | 0 refills | Status: AC | PRN

## 2021-04-28 MED ORDER — ONDANSETRON HCL 4 MG/2ML IJ SOLN
INTRAMUSCULAR | Status: DC | PRN
  Administered 2021-04-28: 4 mg via INTRAVENOUS

## 2021-04-28 MED ORDER — MIDAZOLAM HCL 2 MG/2ML IJ SOLN
INTRAMUSCULAR | Status: AC
  Filled 2021-04-28: qty 2

## 2021-04-28 MED ORDER — BUPIVACAINE LIPOSOME 1.3 % IJ SUSP
INTRAMUSCULAR | Status: DC | PRN
  Administered 2021-04-28: 20 mL

## 2021-04-28 MED ORDER — SODIUM CHLORIDE 0.9 % IR SOLN
Status: DC | PRN
  Administered 2021-04-28: 1000 mL

## 2021-04-28 MED ORDER — ORAL CARE MOUTH RINSE: 15.0000 mL | Freq: Once | OROMUCOSAL | Status: AC

## 2021-04-28 MED ORDER — LIDOCAINE HCL (PF) 2 % IJ SOLN
INTRAMUSCULAR | Status: AC
  Filled 2021-04-28: qty 5

## 2021-04-28 MED ORDER — FENTANYL CITRATE (PF) 100 MCG/2ML IJ SOLN
INTRAMUSCULAR | Status: AC
  Filled 2021-04-28: qty 2

## 2021-04-28 MED ORDER — HEMOSTATIC AGENTS (NO CHARGE) OPTIME
TOPICAL | Status: DC | PRN
  Administered 2021-04-28: 1 via TOPICAL

## 2021-04-28 MED ORDER — LACTATED RINGERS IV SOLN: INTRAVENOUS | Status: DC

## 2021-04-28 MED ORDER — CEFAZOLIN SODIUM-DEXTROSE 2-4 GM/100ML-% IV SOLN
2.0000 g | INTRAVENOUS | Status: AC
  Administered 2021-04-28: 2 g via INTRAVENOUS
  Filled 2021-04-28: qty 100

## 2021-04-28 MED ORDER — CHLORHEXIDINE GLUCONATE 0.12 % MT SOLN
15.0000 mL | Freq: Once | OROMUCOSAL | Status: AC
  Administered 2021-04-28: 15 mL via OROMUCOSAL

## 2021-04-28 MED ORDER — MIDAZOLAM HCL 5 MG/5ML IJ SOLN
INTRAMUSCULAR | Status: DC | PRN
  Administered 2021-04-28: 2 mg via INTRAVENOUS

## 2021-04-28 MED ORDER — EPHEDRINE 5 MG/ML INJ
INTRAVENOUS | Status: AC
  Filled 2021-04-28: qty 5

## 2021-04-28 SURGICAL SUPPLY — 41 items
ADH SKN CLS APL DERMABOND .7 (GAUZE/BANDAGES/DRESSINGS) ×1
APL PRP STRL LF DISP 70% ISPRP (MISCELLANEOUS) ×1
APPLIER CLIP ROT 10 11.4 M/L (STAPLE) ×2
APR CLP MED LRG 11.4X10 (STAPLE) ×1
BAG RETRIEVAL 10 (BASKET) ×1
CHLORAPREP W/TINT 26 (MISCELLANEOUS) ×2 IMPLANT
CLIP APPLIE ROT 10 11.4 M/L (STAPLE) ×1 IMPLANT
CLOTH BEACON ORANGE TIMEOUT ST (SAFETY) ×2 IMPLANT
COVER LIGHT HANDLE STERIS (MISCELLANEOUS) ×4 IMPLANT
DERMABOND ADVANCED (GAUZE/BANDAGES/DRESSINGS) ×1
DERMABOND ADVANCED .7 DNX12 (GAUZE/BANDAGES/DRESSINGS) ×1 IMPLANT
ELECT REM PT RETURN 9FT ADLT (ELECTROSURGICAL) ×2
ELECTRODE REM PT RTRN 9FT ADLT (ELECTROSURGICAL) ×1 IMPLANT
GAUZE 4X4 16PLY ~~LOC~~+RFID DBL (SPONGE) ×2 IMPLANT
GLOVE SURG LTX SZ6.5 (GLOVE) ×2 IMPLANT
GLOVE SURG POLYISO LF SZ7.5 (GLOVE) ×2 IMPLANT
GLOVE SURG UNDER POLY LF SZ7 (GLOVE) ×6 IMPLANT
GOWN STRL REUS W/TWL LRG LVL3 (GOWN DISPOSABLE) ×6 IMPLANT
HEMOSTAT SNOW SURGICEL 2X4 (HEMOSTASIS) ×2 IMPLANT
INST SET LAPROSCOPIC AP (KITS) ×2 IMPLANT
KIT TURNOVER KIT A (KITS) ×2 IMPLANT
MANIFOLD NEPTUNE II (INSTRUMENTS) ×2 IMPLANT
NEEDLE HYPO 18GX1.5 BLUNT FILL (NEEDLE) ×2 IMPLANT
NEEDLE HYPO 21X1.5 SAFETY (NEEDLE) ×2 IMPLANT
NEEDLE INSUFFLATION 14GA 120MM (NEEDLE) ×2 IMPLANT
NS IRRIG 1000ML POUR BTL (IV SOLUTION) ×2 IMPLANT
PACK LAP CHOLE LZT030E (CUSTOM PROCEDURE TRAY) ×2 IMPLANT
PAD ARMBOARD 7.5X6 YLW CONV (MISCELLANEOUS) ×2 IMPLANT
SET BASIN LINEN APH (SET/KITS/TRAYS/PACK) ×2 IMPLANT
SET TUBE SMOKE EVAC HIGH FLOW (TUBING) ×2 IMPLANT
SLEEVE ENDOPATH XCEL 5M (ENDOMECHANICALS) ×2 IMPLANT
SUT MNCRL AB 4-0 PS2 18 (SUTURE) ×4 IMPLANT
SUT VICRYL 0 UR6 27IN ABS (SUTURE) ×2 IMPLANT
SYR 20ML LL LF (SYRINGE) ×4 IMPLANT
SYS BAG RETRIEVAL 10MM (BASKET) ×1
SYSTEM BAG RETRIEVAL 10MM (BASKET) ×1 IMPLANT
TROCAR ENDO BLADELESS 11MM (ENDOMECHANICALS) ×2 IMPLANT
TROCAR XCEL NON-BLD 5MMX100MML (ENDOMECHANICALS) ×2 IMPLANT
TROCAR XCEL UNIV SLVE 11M 100M (ENDOMECHANICALS) ×2 IMPLANT
TUBE CONNECTING 12X1/4 (SUCTIONS) ×2 IMPLANT
WARMER LAPAROSCOPE (MISCELLANEOUS) ×2 IMPLANT

## 2021-04-28 NOTE — Op Note (Signed)
Patient:  Sheena Wagner  DOB:  1980-09-02  MRN:  [id number]   Preop Diagnosis: Biliary colic, cholelithiasis  Postop Diagnosis: Same  Procedure: Laparoscopic cholecystectomy  Surgeon: Franky Macho, MD  Assistant: Algis Greenhouse, MD  Anes: General endotracheal  Indications: Patient is a 40 year old Hispanic female who presents with biliary colic secondary to cholelithiasis.  The risks and benefits of the procedure including bleeding, infection, hepatobiliary injury, the possibility of an open procedure were fully explained to the patient through an interpreter, who gave informed consent.  Procedure note: The patient was placed in the supine position.  After induction of general endotracheal anesthesia, the abdomen was prepped and draped using the usual sterile technique with ChloraPrep.  Surgical site confirmation was performed.  A supraumbilical incision was made down to the fascia.  A Veress needle was introduced into the abdominal cavity and confirmation of placement was done using the saline drop test.  The abdomen was then insufflated to 15 mmHg pressure.  An 11 mm trocar was introduced into the abdominal cavity under direct visualization without difficulty.  The patient was placed in reverse Trendelenburg position and an additional 11 mm trocar was placed in the epigastric region and 5 mm trochars were placed in the right upper quadrant and right flank regions.  The liver was inspected and noted to be within normal limits.  The gallbladder was retracted in a dynamic fashion in order to provide a critical view of the triangle of [place].  The cystic duct was first identified.  Its junction to the infundibulum was fully identified.  Endoclips were placed proximally distally on the cystic duct, and the cystic duct was divided.  This was likewise done to the cystic artery.  The gallbladder was freed away from the gallbladder fossa using Bovie electrocautery.  The gallbladder was delivered  through the epigastric trocar site using an Endo Catch bag.  The gallbladder fossa was inspected and [place] significant abnormal bleeding or bile leakage was noted.  Surgicel was placed in the gallbladder fossa.  All fluid and air were then evacuated from the abdominal cavity prior to removal of the trochars.  All wounds were irrigated with normal saline.  All wounds were injected with Exparel.  The epigastric fascia was reapproximated using 0 Vicryl interrupted suture.  All skin incisions were closed using a 4-0 Monocryl subcuticular suture.  Dermabond was applied.  All tape and needle counts were correct at the end of the procedure.  The patient was extubated in the operating room and transferred to PACU in stable condition.  Complications: None  EBL: Minimal  Specimen: Gallbladder

## 2021-04-28 NOTE — Anesthesia Preprocedure Evaluation (Signed)
Anesthesia Evaluation  Patient identified by MRN, date of birth, ID band Patient awake    Reviewed: Allergy & Precautions, H&P , NPO status , Patient's Chart, lab work & pertinent test results, reviewed documented beta blocker date and time   Airway Mallampati: II  TM Distance: >3 FB Neck ROM: full    Dental no notable dental hx.    Pulmonary neg pulmonary ROS,    Pulmonary exam normal breath sounds clear to auscultation       Cardiovascular Exercise Tolerance: Good hypertension, negative cardio ROS   Rhythm:regular Rate:Normal     Neuro/Psych negative neurological ROS  negative psych ROS   GI/Hepatic negative GI ROS, Neg liver ROS,   Endo/Other  negative endocrine ROS  Renal/GU negative Renal ROS  negative genitourinary   Musculoskeletal   Abdominal   Peds  Hematology negative hematology ROS (+)   Anesthesia Other Findings   Reproductive/Obstetrics negative OB ROS                             Anesthesia Physical Anesthesia Plan  ASA: 2  Anesthesia Plan: General and General ETT   Post-op Pain Management:    Induction:   PONV Risk Score and Plan: Ondansetron  Airway Management Planned:   Additional Equipment:   Intra-op Plan:   Post-operative Plan:   Informed Consent: I have reviewed the patients History and Physical, chart, labs and discussed the procedure including the risks, benefits and alternatives for the proposed anesthesia with the patient or authorized representative who has indicated his/her understanding and acceptance.     Dental Advisory Given  Plan Discussed with: CRNA  Anesthesia Plan Comments:         Anesthesia Quick Evaluation  

## 2021-04-28 NOTE — Interval H&P Note (Signed)
History and Physical Interval Note:  04/28/2021 9:07 AM  Sheena Wagner  has presented today for surgery, with the diagnosis of Cholelithiasis.  The various methods of treatment have been discussed with the patient and family. After consideration of risks, benefits and other options for treatment, the patient has consented to  Procedure(s): LAPAROSCOPIC CHOLECYSTECTOMY (N/A) as a surgical intervention.  The patient's history has been reviewed, patient examined, no change in status, stable for surgery.  I have reviewed the patient's chart and labs.  Questions were answered to the patient's satisfaction.     Franky Macho

## 2021-04-28 NOTE — Progress Notes (Signed)
  April 28, 2021  Patient: Sheena Wagner  Date of Birth: February 20, 1981  Date of Visit: 04/13/2021    To Whom It May Concern:  Tannia Contino was seen and treated in our short stay center on 04-28-21. Please excuse her son, Francesco Runner from school for today.  may return to school on Monday, 05/01/21 .  Sincerely,   Enid Derry, RN [facility]

## 2021-04-28 NOTE — Anesthesia Postprocedure Evaluation (Signed)
Anesthesia Post Note  Patient: Sheena Wagner  Procedure(s) Performed: LAPAROSCOPIC CHOLECYSTECTOMY (Abdomen)  Patient location during evaluation: Phase II Anesthesia Type: General Level of consciousness: awake Pain management: pain level controlled Vital Signs Assessment: post-procedure vital signs reviewed and stable Respiratory status: spontaneous breathing and respiratory function stable Cardiovascular status: blood pressure returned to baseline and stable Postop Assessment: no headache and no apparent nausea or vomiting Anesthetic complications: no Comments: Late entry   No notable events documented.   Last Vitals:  Vitals:   04/28/21 1200 04/28/21 1212  BP: (!) 130/94 (!) 135/92  Pulse: 81 79  Resp: (!) 9 14  Temp:    SpO2: 99% 98%    Last Pain:  Vitals:   04/28/21 1212  TempSrc:   PainSc: 0-No pain                 Windell Norfolk

## 2021-04-28 NOTE — Anesthesia Procedure Notes (Signed)
Procedure Name: Intubation Date/Time: 04/28/2021 10:30 AM Performed by: Hewitt Blade, CRNA Pre-anesthesia Checklist: Patient identified, Emergency Drugs available, Suction available and Patient being monitored Patient Re-evaluated:Patient Re-evaluated prior to induction Oxygen Delivery Method: Circle system utilized Preoxygenation: Pre-oxygenation with 100% oxygen Induction Type: IV induction Ventilation: Mask ventilation without difficulty Laryngoscope Size: Mac and 3 Tube type: Oral Tube size: 7.0 mm Number of attempts: 1 Airway Equipment and Method: Stylet Placement Confirmation: ETT inserted through vocal cords under direct vision, positive ETCO2 and breath sounds checked- equal and bilateral Secured at: 21 cm Tube secured with: Tape Dental Injury: Teeth and Oropharynx as per pre-operative assessment

## 2021-04-28 NOTE — Transfer of Care (Signed)
Immediate Anesthesia Transfer of Care Note  Patient: Sheena Wagner  Procedure(s) Performed: LAPAROSCOPIC CHOLECYSTECTOMY (Abdomen)  Patient Location: PACU  Anesthesia Type:General  Level of Consciousness: awake  Airway & Oxygen Therapy: Patient Spontanous Breathing  Post-op Assessment: Report given to RN and Post -op Vital signs reviewed and stable  Post vital signs: Reviewed and stable  Last Vitals:  Vitals Value Taken Time  BP 148/100 04/28/21 1115  Temp 36.9 C 04/28/21 1115  Pulse 98 04/28/21 1118  Resp 14 04/28/21 1118  SpO2 99 % 04/28/21 1118  Vitals shown include unvalidated device data.  Last Pain:  Vitals:   04/28/21 0910  TempSrc: Oral  PainSc: 0-No pain         Complications: No notable events documented.

## 2021-04-28 NOTE — Interval H&P Note (Signed)
History and Physical Interval Note:  04/28/2021 9:05 AM  Sheena Wagner  has presented today for surgery, with the diagnosis of Cholelithiasis.  The various methods of treatment have been discussed with the patient and family. After consideration of risks, benefits and other options for treatment, the patient has consented to  Procedure(s): LAPAROSCOPIC CHOLECYSTECTOMY (N/A) as a surgical intervention.  The patient's history has been reviewed, patient examined, no change in status, stable for surgery.  I have reviewed the patient's chart and labs.  Questions were answered to the patient's satisfaction.     Franky Macho

## 2021-05-01 ENCOUNTER — Encounter (HOSPITAL_COMMUNITY): Payer: Self-pay | Admitting: General Surgery

## 2021-05-01 LAB — SURGICAL PATHOLOGY

## 2021-05-06 ENCOUNTER — Telehealth (INDEPENDENT_AMBULATORY_CARE_PROVIDER_SITE_OTHER): Payer: Self-pay | Admitting: General Surgery

## 2021-05-06 DIAGNOSIS — Z09 Encounter for follow-up examination after completed treatment for conditions other than malignant neoplasm: Secondary | ICD-10-CM

## 2021-05-06 NOTE — Telephone Encounter (Signed)
Virtual telephone postoperative visit performed with patient.  Her daughter was able to translate the call.  The patient has no complaints.  She is doing well.  I told her to call me should any problems arise.  As this was a part of the global surgical fee, this was not a billable visit.  Total telephone time was 2 minutes.

## 2021-05-16 ENCOUNTER — Encounter: Payer: Self-pay | Admitting: *Deleted

## 2021-05-17 ENCOUNTER — Other Ambulatory Visit (HOSPITAL_COMMUNITY): Payer: Self-pay | Admitting: Nurse Practitioner

## 2021-05-17 DIAGNOSIS — Z1231 Encounter for screening mammogram for malignant neoplasm of breast: Secondary | ICD-10-CM

## 2021-05-25 ENCOUNTER — Ambulatory Visit (HOSPITAL_COMMUNITY)
Admission: RE | Admit: 2021-05-25 | Discharge: 2021-05-25 | Disposition: A | Discharge status: Home / Self Care | Payer: Self-pay | Source: Ambulatory Visit | Attending: Nurse Practitioner | Admitting: Nurse Practitioner

## 2021-05-25 ENCOUNTER — Other Ambulatory Visit: Payer: Self-pay

## 2021-05-25 DIAGNOSIS — Z1231 Encounter for screening mammogram for malignant neoplasm of breast: Secondary | ICD-10-CM | POA: Insufficient documentation

## 2021-06-20 ENCOUNTER — Other Ambulatory Visit (HOSPITAL_COMMUNITY)
Admission: RE | Admit: 2021-06-20 | Discharge: 2021-06-20 | Disposition: A | Discharge status: Home / Self Care | Payer: Self-pay | Source: Ambulatory Visit | Attending: Nurse Practitioner | Admitting: Nurse Practitioner

## 2021-06-20 DIAGNOSIS — L918 Other hypertrophic disorders of the skin: Secondary | ICD-10-CM | POA: Insufficient documentation

## 2021-06-23 LAB — SURGICAL PATHOLOGY

## 2021-07-20 ENCOUNTER — Other Ambulatory Visit (HOSPITAL_COMMUNITY): Payer: Self-pay | Admitting: Obstetrics and Gynecology

## 2021-07-20 DIAGNOSIS — R928 Other abnormal and inconclusive findings on diagnostic imaging of breast: Secondary | ICD-10-CM

## 2021-07-20 DIAGNOSIS — R921 Mammographic calcification found on diagnostic imaging of breast: Secondary | ICD-10-CM

## 2021-08-18 ENCOUNTER — Inpatient Hospital Stay (HOSPITAL_COMMUNITY): Payer: Self-pay | Attending: Obstetrics and Gynecology | Admitting: *Deleted

## 2021-08-18 ENCOUNTER — Encounter (HOSPITAL_COMMUNITY): Payer: Self-pay

## 2021-08-18 ENCOUNTER — Other Ambulatory Visit: Payer: Self-pay

## 2021-08-18 VITALS — BP 136/99 | Wt 155.9 lb

## 2021-08-18 DIAGNOSIS — N644 Mastodynia: Secondary | ICD-10-CM

## 2021-08-18 DIAGNOSIS — Z1239 Encounter for other screening for malignant neoplasm of breast: Secondary | ICD-10-CM

## 2021-08-18 NOTE — Patient Instructions (Signed)
Explained breast self awareness with [name]. Patient did not need a Pap smear today due to last Pap smear and HPV typing was 05/17/2021. Let her know BCCCP will cover Pap smears and HPV typing every 5 years unless has a history of abnormal Pap smears. Referred patient to [facility]ammography for a right breast diagnostic mammogram per recommendation. Appointment scheduled Tuesday, August 22, 2021 at 1100. Patient aware of appointment and will be there. [name] verbalized understanding. ? ?Sheena Wagner, Sheena Maser, RN ?10:23 AM ? ? ? ? ?

## 2021-08-18 NOTE — Progress Notes (Signed)
[name] is a 41 y.o. female who presents to [facility] clinic today with complaint of bilateral diffuse breast pain x one month that comes and goes. Patient rates the pain at a 5 out of 10. Patient referred to [facility]CP by the [facility] Department due to having a screening mammogram completed 05/25/2021 that additional imaging of the right breast is recommended for follow up. ?  ?Pap Smear: Pap smear not completed today. Last Pap smear was 05/17/2021 at the [facility] clinic and was normal with negative HPV. Per patient has no history of an abnormal Pap smear. Last Pap smear result is available in Epic. ?  ?Physical exam: ?Breasts ?Breasts symmetrical. No skin abnormalities bilateral breasts. No nipple retraction bilateral breasts. No nipple discharge bilateral breasts. No lymphadenopathy. No lumps palpated bilateral breasts. No complaints of pain or tenderness on exam.    ? ?MS DIGITAL SCREENING TOMO BILATERAL ? ?Result Date: 05/26/2021 ?CLINICAL DATA:  Screening. EXAM: DIGITAL SCREENING BILATERAL MAMMOGRAM WITH TOMOSYNTHESIS AND CAD TECHNIQUE: Bilateral screening digital craniocaudal and mediolateral oblique mammograms were obtained. Bilateral screening digital breast tomosynthesis was performed. The images were evaluated with computer-aided detection. COMPARISON:  None. ACR Breast Density Category b: There are scattered areas of fibroglandular density. FINDINGS: In the right breast, calcifications warrant further evaluation with magnified views. In the left breast, no findings suspicious for malignancy. IMPRESSION: Further evaluation is suggested for calcifications in the right breast. RECOMMENDATION: Diagnostic mammogram of the right breast. (Code:[id number]) The patient will be contacted regarding the findings, and additional imaging will be scheduled. BI-RADS CATEGORY  0: Incomplete. Need additional imaging evaluation and/or prior mammograms for comparison.  Electronically Signed   By: Sherian Rein M.D.   On: 05/26/2021 07:53   ? ?Pelvic/Bimanual ?Pap is not indicated today per BCCCP guidelines. ?  ?Smoking History: ?Patient has never smoked. ?  ?Patient Navigation: ?Patient education provided. Access to services provided for patient through [facility] program. Spanish interpreter Lynnell Chad from [facility] provided.  ?  ?Breast and Cervical Cancer Risk Assessment: ?Patient does not have family history of breast cancer, known genetic mutations, or radiation treatment to the chest before age 20. Patient does not have history of cervical dysplasia, immunocompromised, or DES exposure in-utero. ? ?Risk Assessment   ? ? Risk Scores   ? ?   08/18/2021  ? Last edited by: Narda Rutherford, LPN  ? 5-year risk: 0.3 %  ? Lifetime risk: 6.4 %  ? ?  ?  ? ?  ? ? ?A: ?BCCCP exam without pap smear ?Complaint of bilateral breast pain. ? ?P: ?Referred patient to [facility] Mammography for a right breast diagnostic mammogram per recommendation. Appointment scheduled Tuesday, August 22, 2021 at 1100. ? ?Priscille Heidelberg, RN ?08/18/2021 10:23 AM   ?

## 2021-08-22 ENCOUNTER — Ambulatory Visit (HOSPITAL_COMMUNITY)
Admission: RE | Admit: 2021-08-22 | Discharge: 2021-08-22 | Disposition: A | Discharge status: Home / Self Care | Payer: Self-pay | Source: Ambulatory Visit | Attending: Obstetrics and Gynecology | Admitting: Obstetrics and Gynecology

## 2021-08-22 ENCOUNTER — Other Ambulatory Visit: Payer: Self-pay

## 2021-08-22 DIAGNOSIS — R921 Mammographic calcification found on diagnostic imaging of breast: Secondary | ICD-10-CM | POA: Insufficient documentation

## 2021-08-22 DIAGNOSIS — R928 Other abnormal and inconclusive findings on diagnostic imaging of breast: Secondary | ICD-10-CM | POA: Insufficient documentation

## 2022-01-01 ENCOUNTER — Other Ambulatory Visit (HOSPITAL_COMMUNITY): Payer: Self-pay | Admitting: Obstetrics and Gynecology

## 2022-01-01 DIAGNOSIS — Z09 Encounter for follow-up examination after completed treatment for conditions other than malignant neoplasm: Secondary | ICD-10-CM

## 2022-01-01 DIAGNOSIS — R921 Mammographic calcification found on diagnostic imaging of breast: Secondary | ICD-10-CM

## 2022-02-27 ENCOUNTER — Ambulatory Visit (HOSPITAL_COMMUNITY)
Admission: RE | Admit: 2022-02-27 | Discharge: 2022-02-27 | Disposition: A | Discharge status: Home / Self Care | Payer: Self-pay | Source: Ambulatory Visit | Attending: Obstetrics and Gynecology | Admitting: Obstetrics and Gynecology

## 2022-02-27 DIAGNOSIS — R921 Mammographic calcification found on diagnostic imaging of breast: Secondary | ICD-10-CM | POA: Insufficient documentation

## 2022-02-27 DIAGNOSIS — Z09 Encounter for follow-up examination after completed treatment for conditions other than malignant neoplasm: Secondary | ICD-10-CM | POA: Insufficient documentation

## 2022-03-26 ENCOUNTER — Other Ambulatory Visit: Payer: Self-pay

## 2022-03-26 ENCOUNTER — Emergency Department (HOSPITAL_COMMUNITY)
Admission: EM | Admit: 2022-03-26 | Discharge: 2022-03-26 | Disposition: A | Discharge status: Home / Self Care | Payer: Self-pay | Attending: Emergency Medicine | Admitting: Emergency Medicine

## 2022-03-26 ENCOUNTER — Emergency Department (HOSPITAL_COMMUNITY): Payer: Self-pay

## 2022-03-26 ENCOUNTER — Encounter (HOSPITAL_COMMUNITY): Payer: Self-pay

## 2022-03-26 DIAGNOSIS — Z79899 Other long term (current) drug therapy: Secondary | ICD-10-CM | POA: Insufficient documentation

## 2022-03-26 DIAGNOSIS — R519 Headache, unspecified: Secondary | ICD-10-CM | POA: Diagnosis present

## 2022-03-26 DIAGNOSIS — Y92410 Unspecified street and highway as the place of occurrence of the external cause: Secondary | ICD-10-CM | POA: Insufficient documentation

## 2022-03-26 DIAGNOSIS — M545 Low back pain, unspecified: Secondary | ICD-10-CM | POA: Diagnosis not present

## 2022-03-26 DIAGNOSIS — R109 Unspecified abdominal pain: Secondary | ICD-10-CM | POA: Insufficient documentation

## 2022-03-26 DIAGNOSIS — I10 Essential (primary) hypertension: Secondary | ICD-10-CM | POA: Insufficient documentation

## 2022-03-26 DIAGNOSIS — M25572 Pain in left ankle and joints of left foot: Secondary | ICD-10-CM | POA: Insufficient documentation

## 2022-03-26 DIAGNOSIS — M25561 Pain in right knee: Secondary | ICD-10-CM | POA: Insufficient documentation

## 2022-03-26 DIAGNOSIS — M542 Cervicalgia: Secondary | ICD-10-CM | POA: Insufficient documentation

## 2022-03-26 LAB — I-STAT CHEM 8, ED
BUN: 13 mg/dL (ref 6–20)
Calcium, Ion: 1.15 mmol/L (ref 1.15–1.40)
Chloride: 106 mmol/L (ref 98–111)
Creatinine, Ser: 0.4 mg/dL — ABNORMAL LOW (ref 0.44–1.00)
Glucose, Bld: 117 mg/dL — ABNORMAL HIGH (ref 70–99)
HCT: 48 % — ABNORMAL HIGH (ref 36.0–46.0)
Hemoglobin: 16.3 g/dL — ABNORMAL HIGH (ref 12.0–15.0)
Potassium: 3.8 mmol/L (ref 3.5–5.1)
Sodium: 142 mmol/L (ref 135–145)
TCO2: 26 mmol/L (ref 22–32)

## 2022-03-26 LAB — I-STAT BETA HCG BLOOD, ED (MC, WL, AP ONLY): I-stat hCG, quantitative: <5 m[IU]/mL (ref <5)

## 2022-03-26 MED ORDER — IOHEXOL 300 MG/ML  SOLN
100.0000 mL | Freq: Once | INTRAMUSCULAR | Status: AC | PRN
  Administered 2022-03-26: 100 mL via INTRAVENOUS

## 2022-03-26 MED ORDER — IOHEXOL 300 MG/ML  SOLN: 100.0000 mL | Freq: Once | INTRAMUSCULAR | Status: DC | PRN

## 2022-03-26 MED ORDER — HYDROCODONE-ACETAMINOPHEN 5-325 MG PO TABS
1.0000 | ORAL_TABLET | Freq: Once | ORAL | Status: AC
  Administered 2022-03-26: 1 via ORAL
  Filled 2022-03-26: qty 1
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
## 2022-04-02 ENCOUNTER — Other Ambulatory Visit (HOSPITAL_COMMUNITY): Payer: Self-pay | Admitting: Obstetrics and Gynecology

## 2022-04-02 DIAGNOSIS — Z09 Encounter for follow-up examination after completed treatment for conditions other than malignant neoplasm: Secondary | ICD-10-CM

## 2022-04-02 DIAGNOSIS — R921 Mammographic calcification found on diagnostic imaging of breast: Secondary | ICD-10-CM

## 2022-05-29 ENCOUNTER — Ambulatory Visit (HOSPITAL_COMMUNITY)
Admission: RE | Admit: 2022-05-29 | Discharge: 2022-05-29 | Disposition: A | Discharge status: Home / Self Care | Payer: Self-pay | Source: Ambulatory Visit | Attending: Obstetrics and Gynecology | Admitting: Obstetrics and Gynecology

## 2022-05-29 DIAGNOSIS — R921 Mammographic calcification found on diagnostic imaging of breast: Secondary | ICD-10-CM

## 2022-05-29 DIAGNOSIS — Z09 Encounter for follow-up examination after completed treatment for conditions other than malignant neoplasm: Secondary | ICD-10-CM

## 2022-12-01 IMAGING — MG MM DIGITAL DIAGNOSTIC UNILAT*R* W/ TOMO W/ CAD
4 series · 4 of 8 positions shown · non-contrast
Comparison: Screening mammogram dated 05/25/2021.

CLINICAL DATA: Screening recall from baseline mammography for right
breast calcifications.

EXAM:
DIGITAL DIAGNOSTIC UNILATERAL RIGHT MAMMOGRAM WITH TOMOSYNTHESIS AND
CAD
TECHNIQUE: Right digital diagnostic mammography and breast tomosynthesis was
performed. The images were evaluated with computer-aided detection.

[R CC]
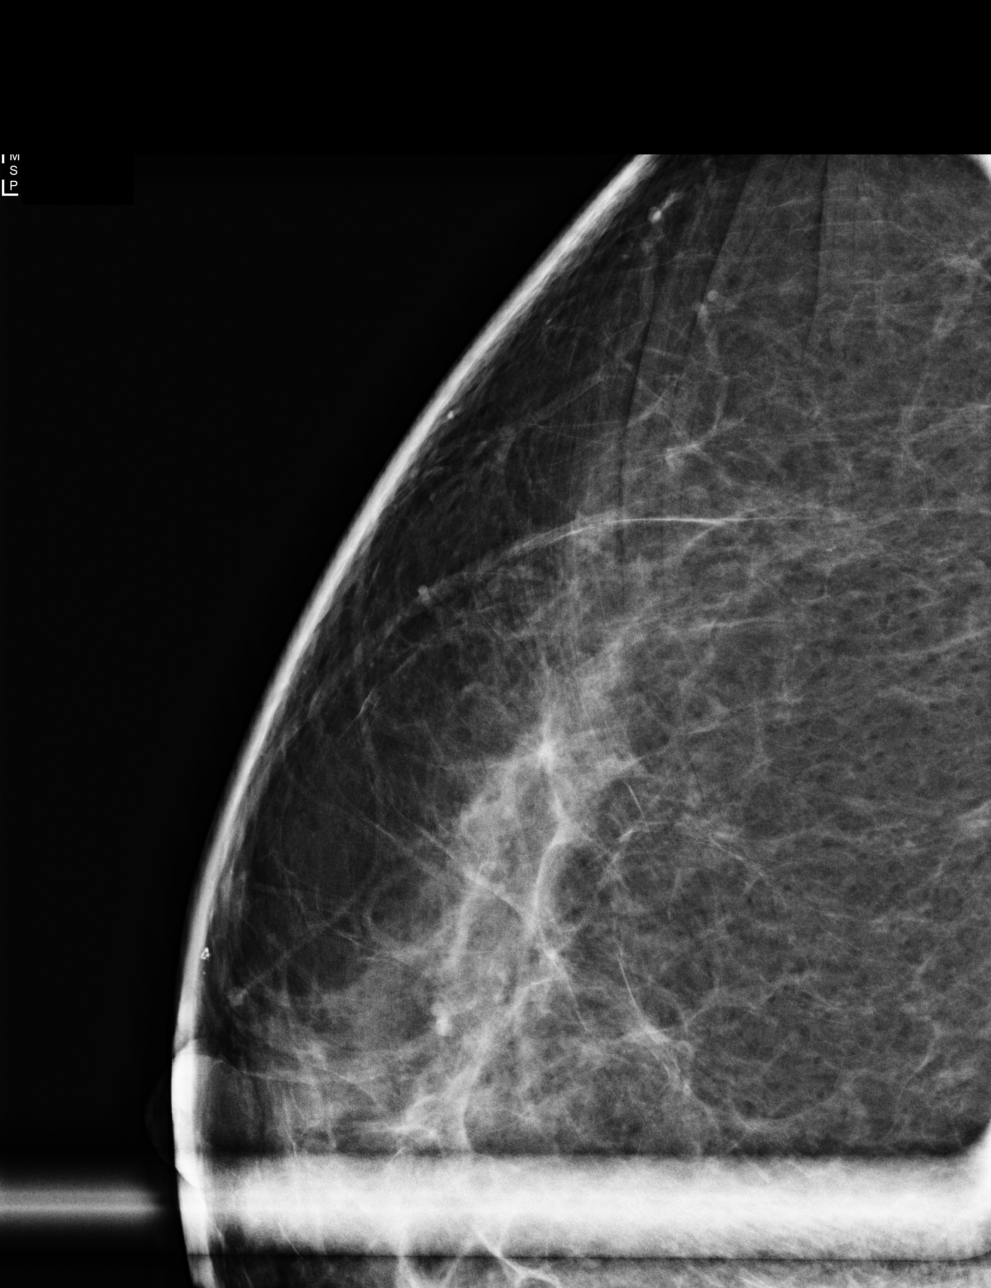

[R ML]
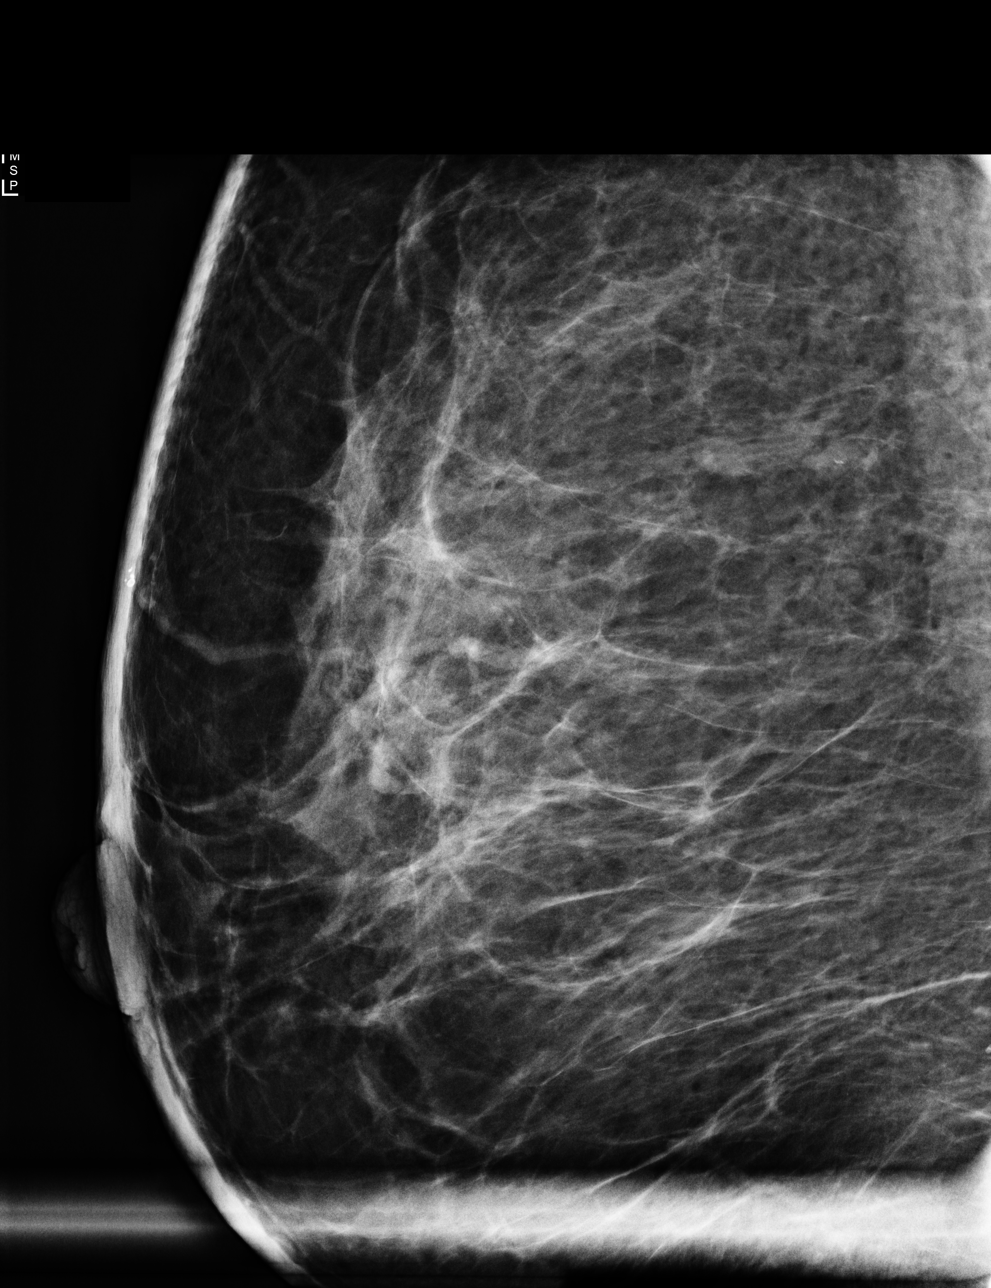

[R ML synth-2D]
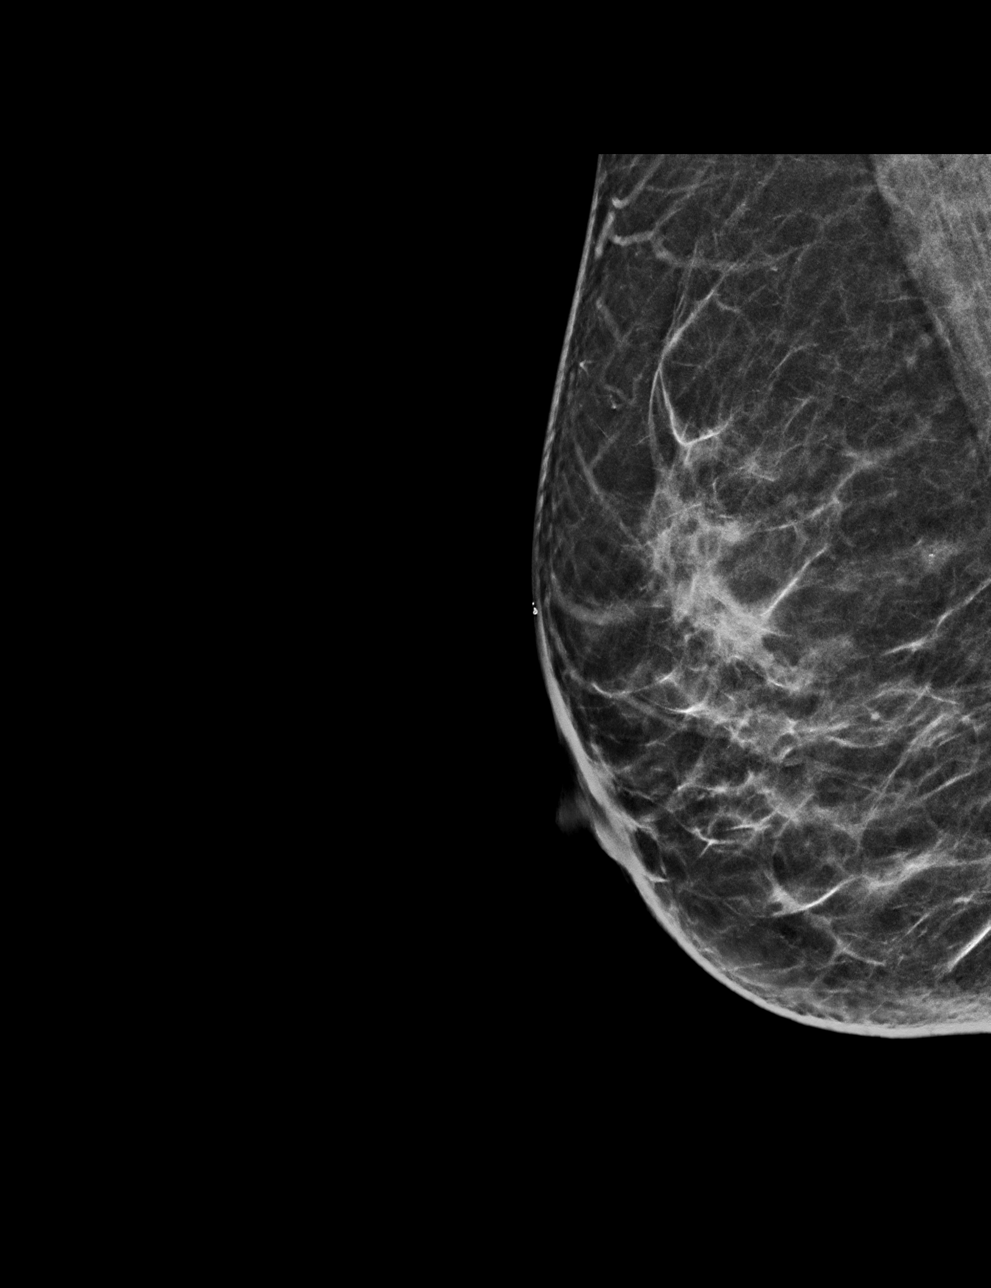

[R ML tomo · tomo slice 31/62.0]
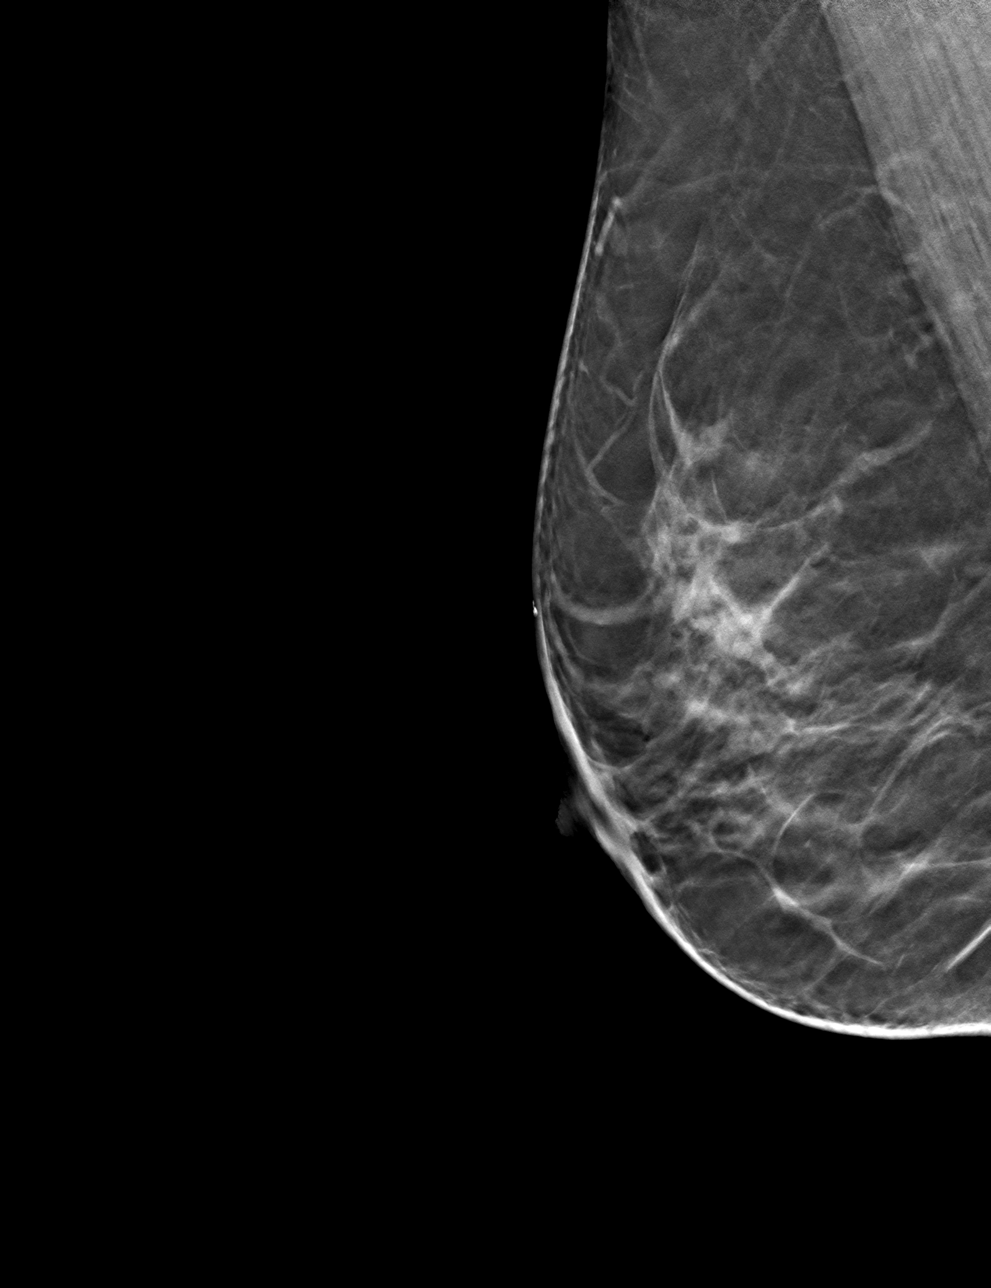

[4 of 8 positions shown; findings below may reference images not displayed]

ACR Breast Density Category b: There are scattered areas of
fibroglandular density.
FINDINGS: Spot compression magnification views were performed over the upper
outer far posterior right breast demonstrating 8 faint group of
predominantly punctate calcifications spanning 0.4 cm, felt to layer
on the spot compression magnification ML view with findings most
suggestive of benign milk of calcium.
IMPRESSION: Probably benign right breast calcifications.

RECOMMENDATION:
Recommend six-month follow-up diagnostic mammography with
magnification views of the right breast.

I have discussed the findings and recommendations with the patient.
If applicable, a reminder letter will be sent to the patient
regarding the next appointment.

BI-RADS CATEGORY  3: Probably benign.
